# Patient Record
Sex: Male | Born: 1965 | Race: White | Hispanic: No | Marital: Married | Smoking: Current every day smoker
Health system: Southern US, Community
[De-identification: ages and names within clinical notes are randomized; demographics above are authoritative.]

## PROBLEM LIST (undated history)

## (undated) DIAGNOSIS — F32A Depression, unspecified: Secondary | ICD-10-CM

## (undated) DIAGNOSIS — F419 Anxiety disorder, unspecified: Secondary | ICD-10-CM

## (undated) DIAGNOSIS — E785 Hyperlipidemia, unspecified: Secondary | ICD-10-CM

## (undated) DIAGNOSIS — F329 Major depressive disorder, single episode, unspecified: Secondary | ICD-10-CM

## (undated) DIAGNOSIS — I10 Essential (primary) hypertension: Secondary | ICD-10-CM

## (undated) DIAGNOSIS — J449 Chronic obstructive pulmonary disease, unspecified: Secondary | ICD-10-CM

## (undated) DIAGNOSIS — T7840XA Allergy, unspecified, initial encounter: Secondary | ICD-10-CM

## (undated) DIAGNOSIS — K635 Polyp of colon: Secondary | ICD-10-CM

## (undated) DIAGNOSIS — D689 Coagulation defect, unspecified: Secondary | ICD-10-CM

## (undated) DIAGNOSIS — Z8719 Personal history of other diseases of the digestive system: Secondary | ICD-10-CM

## (undated) DIAGNOSIS — K219 Gastro-esophageal reflux disease without esophagitis: Secondary | ICD-10-CM

## (undated) DIAGNOSIS — K589 Irritable bowel syndrome without diarrhea: Secondary | ICD-10-CM

## (undated) DIAGNOSIS — I82409 Acute embolism and thrombosis of unspecified deep veins of unspecified lower extremity: Secondary | ICD-10-CM

## (undated) DIAGNOSIS — Z86718 Personal history of other venous thrombosis and embolism: Secondary | ICD-10-CM

## (undated) DIAGNOSIS — I2699 Other pulmonary embolism without acute cor pulmonale: Secondary | ICD-10-CM

## (undated) HISTORY — DX: Polyp of colon: K63.5

## (undated) HISTORY — PX: WISDOM TOOTH EXTRACTION: SHX21

## (undated) HISTORY — DX: Anxiety disorder, unspecified: F41.9

## (undated) HISTORY — DX: Coagulation defect, unspecified: D68.9

## (undated) HISTORY — DX: Acute embolism and thrombosis of unspecified deep veins of unspecified lower extremity: I82.409

## (undated) HISTORY — PX: UPPER GASTROINTESTINAL ENDOSCOPY: SHX188

## (undated) HISTORY — PX: POLYPECTOMY: SHX149

## (undated) HISTORY — DX: Chronic obstructive pulmonary disease, unspecified: J44.9

## (undated) HISTORY — DX: Essential (primary) hypertension: I10

## (undated) HISTORY — DX: Personal history of other diseases of the digestive system: Z87.19

## (undated) HISTORY — PX: CHOLECYSTECTOMY: SHX55

## (undated) HISTORY — DX: Allergy, unspecified, initial encounter: T78.40XA

## (undated) HISTORY — PX: TONSILLECTOMY AND ADENOIDECTOMY: SUR1326

## (undated) HISTORY — DX: Other pulmonary embolism without acute cor pulmonale: I26.99

## (undated) HISTORY — DX: Gastro-esophageal reflux disease without esophagitis: K21.9

## (undated) HISTORY — DX: Major depressive disorder, single episode, unspecified: F32.9

## (undated) HISTORY — DX: Personal history of other venous thrombosis and embolism: Z86.718

## (undated) HISTORY — DX: Irritable bowel syndrome without diarrhea: K58.9

## (undated) HISTORY — DX: Depression, unspecified: F32.A

## (undated) HISTORY — DX: Hyperlipidemia, unspecified: E78.5

---

## 1998-03-20 ENCOUNTER — Ambulatory Visit (HOSPITAL_COMMUNITY): Admission: RE | Admit: 1998-03-20 | Discharge: 1998-03-20 | Payer: Self-pay | Admitting: Neurosurgery

## 1998-03-20 ENCOUNTER — Encounter: Payer: Self-pay | Admitting: Neurosurgery

## 1998-04-02 ENCOUNTER — Encounter: Admission: RE | Admit: 1998-04-02 | Discharge: 1998-07-01 | Payer: Self-pay | Admitting: Anesthesiology

## 2000-08-11 ENCOUNTER — Emergency Department (HOSPITAL_COMMUNITY): Admission: EM | Admit: 2000-08-11 | Discharge: 2000-08-11 | Payer: Self-pay | Admitting: Emergency Medicine

## 2003-05-21 ENCOUNTER — Emergency Department (HOSPITAL_COMMUNITY): Admission: EM | Admit: 2003-05-21 | Discharge: 2003-05-21 | Payer: Self-pay | Admitting: Emergency Medicine

## 2004-11-06 ENCOUNTER — Ambulatory Visit (HOSPITAL_COMMUNITY): Admission: RE | Admit: 2004-11-06 | Discharge: 2004-11-06 | Payer: Self-pay | Admitting: Specialist

## 2007-05-15 ENCOUNTER — Encounter: Payer: Self-pay | Admitting: Internal Medicine

## 2007-09-06 ENCOUNTER — Encounter: Payer: Self-pay | Admitting: Internal Medicine

## 2008-03-16 ENCOUNTER — Ambulatory Visit: Payer: Self-pay | Admitting: Internal Medicine

## 2008-03-16 DIAGNOSIS — J449 Chronic obstructive pulmonary disease, unspecified: Secondary | ICD-10-CM

## 2008-05-01 ENCOUNTER — Ambulatory Visit: Payer: Self-pay | Admitting: Internal Medicine

## 2008-05-01 DIAGNOSIS — F17200 Nicotine dependence, unspecified, uncomplicated: Secondary | ICD-10-CM

## 2008-05-01 DIAGNOSIS — J984 Other disorders of lung: Secondary | ICD-10-CM | POA: Insufficient documentation

## 2008-05-04 LAB — CONVERTED CEMR LAB: A-1 Antitrypsin, Ser: 178 mg/dL (ref 83–200)

## 2008-05-08 ENCOUNTER — Telehealth (INDEPENDENT_AMBULATORY_CARE_PROVIDER_SITE_OTHER): Payer: Self-pay | Admitting: *Deleted

## 2008-05-08 ENCOUNTER — Ambulatory Visit: Payer: Self-pay | Admitting: Cardiology

## 2010-02-18 ENCOUNTER — Ambulatory Visit: Payer: Self-pay | Admitting: Internal Medicine

## 2010-03-10 ENCOUNTER — Telehealth (INDEPENDENT_AMBULATORY_CARE_PROVIDER_SITE_OTHER): Payer: Self-pay | Admitting: *Deleted

## 2010-04-03 NOTE — Assessment & Plan Note (Signed)
Summary: Pulmonary/ ext ov with hfa 75%    Copy to:  Dr. Arlan Organ Primary Jetta Murray/Referring Vita Currin:  Dr. Arlan Organ  CC:  DOE- the same.  History of Present Illness: 61   yowm started smoking at age 45   new onset cough/breathing difficulty in setting of URI march 09 never bounced back completely in term of doe x 1 flight.  March 16, 2008 ov for copd eval after flare 2 weeks prior to visit last steroids about a week before ov. no cough at time of ov, doe baseline.   started advair not impressed it's helping but needs less combivent   May 01, 2008 on adavir for pft's, no better.  no cough.  rec symbicort and quit smoking  February 18, 2010 ov cc still smoking  doe 2puffs of symbicort every 2 days on avg, still smoking, no change ex tol or chronic cough and congestion. no purulent sputum/ Pt denies any significant sore throat, dysphagia, itching, sneezing,  nasal congestion or excess secretions,  fever, chills, sweats, unintended wt loss, pleuritic or exertional cp, hempoptysis, change in activity tolerance  orthopnea pnd or leg swelling Pt also denies any obvious fluctuation in symptoms with weather or environmental change or other alleviating or aggravating factors.          Preventive Screening-Counseling & Management  Alcohol-Tobacco     Smoking Status: current     Smoking Cessation Counseling: yes  Current Medications (verified): 1)  Alprazolam 1 Mg Tabs (Alprazolam) .Marland Kitchen.. 1 Tab Every 6 Hours As Needed 2)  Symbicort 160-4.5 Mcg/act  Aero (Budesonide-Formoterol Fumarate) .... 2 Puffs First Thing  in Am and 2 Puffs Again in Pm About 12 Hours Later As Needed 3)  Combivent 103-18 Mcg/act Aero (Ipratropium-Albuterol) .... 2 Puffs Every 4 Hours As Needed 4)  Verapamil Hcl Cr 180 Mg Cr-Tabs (Verapamil Hcl) .Marland Kitchen.. 1 Once Daily 5)  Viibryd 10 Mg Tabs (Vilazodone Hcl) .Marland Kitchen.. 1 Once Daily  Allergies (verified): 1)  ! Pcn 2)  ! Codeine  Past History:  Past Medical  History: manic depressive disorder depression headache, migraine back pain tobacco abuse COPD     - PFT's May 01, 2008 FEV1 2.17 (55%) ratio 40,  no better with B2, DLC0 80     - Alpha 1AT May 01, 2008 > nl levels, neg Z/S     - HFA teaching 75% effective p coaching abdominal pain, periumbilical irritable bowel syndrome Ischemic heart disease, chronic Pulmonary Nodule      -  CT 05/15/2007 RML 5 mm      -  CT  05/08/2008 RML 4 mm      - Not viz on plain cxr February 18, 2010         Vital Signs:  Patient profile:   45 year old male Weight:      208 pounds BMI:     29.11 O2 Sat:      98 % on Room air Temp:     98.2 degrees F oral Pulse rate:   98 / minute BP sitting:   152 / 90  (left arm)  Vitals Entered By: Vernie Murders (February 18, 2010 10:49 AM)  O2 Flow:  Room air  Physical Exam  Additional Exam:  wt 226  03/16/08 > 232 May 01, 2008 > 208 February 18, 2010  HEENT mild turbinate edema.  Oropharynx no thrush or excess pnd or cobblestoning.  No JVD or cervical adenopathy. Mild accessory muscle hypertrophy.  Trachea midline, nl thryroid. Chest was hyperinflated by percussion with diminished breath sounds and moderate increased exp time without wheeze. Hoover sign positive at mid inspiration. Regular rate and rhythm without murmur gallop or rub or increase P2. No edema. Abd: no hsm, nl excursion. Ext warm without cyanosis or clubbing.     CXR  Procedure date:  02/18/2010  Findings:      Comparison: May 21, 2003 and CT dated May 08, 2008   Findings: The cardiac silhouette, mediastinum, pulmonary vasculature are within normal limits.  Both lungs are clear.  The previously described 4 mm nodule in the right middle lobe is not seen on plain film. There is no acute bony abnormality.   IMPRESSION: There is no evidence of acute cardiac or pulmonary process.  Impression & Recommendations:  Problem # 1:  COPD UNSPECIFIED (ICD-496) GOLD II and still smoking      DDX of  difficult airways managment all start with A and  include Adherence, Ace Inhibitors, Acid Reflux, Active Sinus Disease, Alpha 1 Antitripsin deficiency, Anxiety masquerading as Airways dz,  ABPA,  allergy(esp in young), Aspiration (esp in elderly), Adverse effects of DPI,  Active smokers, plus one B  = Beta blocker use.Marland Kitchen    Active smoking greatest concern  Adherence:    I spent extra time with the patient today explaining optimal mdi  technique.  This improved from  50> 75%   Problem # 2:  SMOKER (ICD-305.1)   I emphasized that although we never turn away smokers from the pulmonary clinic, we do ask that they understand that the recommendations that were made won't work nearly as well in the presence of continued cigarette exposure and we may reach a point where we can't help the patient if he/she can't quit smoking.      Problem # 3:  PULMONARY NODULE (ICD-518.89) Discussed in detail all the  indications, usual  risks and alternatives  relative to the benefits with patient who agrees to proceed with conservative f/u with cxr yearly.  Main issue is stopping smoking to reduce risk  Advised that serial screening  CT scans are not the standard yet and the nodule we were concerned about had shrunk on last study and no viz on plain film now almost 3 years later  Medications Added to Medication List This Visit: 1)  Symbicort 160-4.5 Mcg/act Aero (Budesonide-formoterol fumarate) .... 2 puffs first thing  in am and 2 puffs again in pm about 12 hours later as needed 2)  Verapamil Hcl Cr 180 Mg Cr-tabs (Verapamil hcl) .Marland Kitchen.. 1 once daily 3)  Viibryd 10 Mg Tabs (Vilazodone hcl) .Marland Kitchen.. 1 once daily  Other Orders: T-2 View CXR (71020TC) Est. Patient Level IV (09811)  Patient Instructions: 1)  Symbicort 160 2 puffs first thing  in am and 2 puffs again in pm about 12 hours later and then brush teeth 2)  Stopping smoking is the most important aspect of your care. 3)  Yearly cxr is the standard way  to screen for lung cancer.at this point

## 2010-04-03 NOTE — Progress Notes (Signed)
  Phone Note Other Incoming   Request: Send information Summary of Call: Request for records received from [facility]. Request forwarded to [facility].     

## 2011-02-23 ENCOUNTER — Other Ambulatory Visit: Payer: Self-pay | Admitting: Allergy

## 2016-04-01 HISTORY — PX: COLONOSCOPY: SHX174

## 2016-04-30 DIAGNOSIS — Z86718 Personal history of other venous thrombosis and embolism: Secondary | ICD-10-CM

## 2016-04-30 HISTORY — DX: Personal history of other venous thrombosis and embolism: Z86.718

## 2016-12-17 DIAGNOSIS — Z23 Encounter for immunization: Secondary | ICD-10-CM | POA: Diagnosis not present

## 2017-04-28 DIAGNOSIS — J0100 Acute maxillary sinusitis, unspecified: Secondary | ICD-10-CM | POA: Diagnosis not present

## 2017-04-28 DIAGNOSIS — J449 Chronic obstructive pulmonary disease, unspecified: Secondary | ICD-10-CM | POA: Diagnosis not present

## 2017-05-19 ENCOUNTER — Encounter: Payer: Self-pay | Admitting: Gastroenterology

## 2017-06-09 DIAGNOSIS — Z79899 Other long term (current) drug therapy: Secondary | ICD-10-CM | POA: Diagnosis not present

## 2017-06-09 DIAGNOSIS — E782 Mixed hyperlipidemia: Secondary | ICD-10-CM | POA: Diagnosis not present

## 2017-06-10 DIAGNOSIS — Z1331 Encounter for screening for depression: Secondary | ICD-10-CM | POA: Diagnosis not present

## 2017-06-10 DIAGNOSIS — I10 Essential (primary) hypertension: Secondary | ICD-10-CM | POA: Diagnosis not present

## 2017-06-10 DIAGNOSIS — J449 Chronic obstructive pulmonary disease, unspecified: Secondary | ICD-10-CM | POA: Diagnosis not present

## 2017-06-10 DIAGNOSIS — E782 Mixed hyperlipidemia: Secondary | ICD-10-CM | POA: Diagnosis not present

## 2017-06-10 DIAGNOSIS — R140 Abdominal distension (gaseous): Secondary | ICD-10-CM | POA: Diagnosis not present

## 2017-06-18 DIAGNOSIS — R140 Abdominal distension (gaseous): Secondary | ICD-10-CM | POA: Diagnosis not present

## 2017-06-18 DIAGNOSIS — K5730 Diverticulosis of large intestine without perforation or abscess without bleeding: Secondary | ICD-10-CM | POA: Diagnosis not present

## 2017-07-12 DIAGNOSIS — I491 Atrial premature depolarization: Secondary | ICD-10-CM | POA: Diagnosis not present

## 2017-07-12 DIAGNOSIS — R001 Bradycardia, unspecified: Secondary | ICD-10-CM | POA: Diagnosis not present

## 2017-07-12 DIAGNOSIS — J441 Chronic obstructive pulmonary disease with (acute) exacerbation: Secondary | ICD-10-CM | POA: Diagnosis not present

## 2017-07-12 DIAGNOSIS — I10 Essential (primary) hypertension: Secondary | ICD-10-CM | POA: Diagnosis not present

## 2017-07-12 DIAGNOSIS — E785 Hyperlipidemia, unspecified: Secondary | ICD-10-CM | POA: Diagnosis not present

## 2017-07-12 DIAGNOSIS — R0602 Shortness of breath: Secondary | ICD-10-CM | POA: Diagnosis not present

## 2017-07-12 DIAGNOSIS — R05 Cough: Secondary | ICD-10-CM | POA: Diagnosis not present

## 2017-07-12 DIAGNOSIS — R062 Wheezing: Secondary | ICD-10-CM | POA: Diagnosis not present

## 2017-07-13 DIAGNOSIS — R001 Bradycardia, unspecified: Secondary | ICD-10-CM | POA: Diagnosis not present

## 2017-07-13 DIAGNOSIS — I10 Essential (primary) hypertension: Secondary | ICD-10-CM | POA: Diagnosis not present

## 2017-07-13 DIAGNOSIS — E785 Hyperlipidemia, unspecified: Secondary | ICD-10-CM | POA: Diagnosis not present

## 2017-07-13 DIAGNOSIS — J449 Chronic obstructive pulmonary disease, unspecified: Secondary | ICD-10-CM | POA: Diagnosis not present

## 2017-07-13 DIAGNOSIS — J441 Chronic obstructive pulmonary disease with (acute) exacerbation: Secondary | ICD-10-CM | POA: Diagnosis not present

## 2017-07-13 DIAGNOSIS — I491 Atrial premature depolarization: Secondary | ICD-10-CM | POA: Diagnosis not present

## 2017-07-13 DIAGNOSIS — J189 Pneumonia, unspecified organism: Secondary | ICD-10-CM | POA: Diagnosis not present

## 2017-08-02 DIAGNOSIS — Z0000 Encounter for general adult medical examination without abnormal findings: Secondary | ICD-10-CM | POA: Diagnosis not present

## 2017-08-02 DIAGNOSIS — J449 Chronic obstructive pulmonary disease, unspecified: Secondary | ICD-10-CM | POA: Diagnosis not present

## 2017-08-11 ENCOUNTER — Other Ambulatory Visit: Payer: Self-pay

## 2017-08-11 NOTE — Patient Outreach (Signed)
[facility]) Care Management  08/11/2017  SAHEJ SCHRIEBER [birth year] [id number]   Medication Adherence call to Mr : Leeon Makar left a message for patient to call back Mr : Bartoszek is past due on olmesatan/HCTX 20/12 :5  under [place].  [place] Management Direct Dial [phone]  Fax [phone] [email or web address]

## 2017-08-13 DIAGNOSIS — J449 Chronic obstructive pulmonary disease, unspecified: Secondary | ICD-10-CM | POA: Diagnosis not present

## 2017-08-17 ENCOUNTER — Encounter: Payer: Self-pay | Admitting: Gastroenterology

## 2017-09-12 DIAGNOSIS — J449 Chronic obstructive pulmonary disease, unspecified: Secondary | ICD-10-CM | POA: Diagnosis not present

## 2017-09-20 ENCOUNTER — Other Ambulatory Visit: Payer: Self-pay

## 2017-09-20 ENCOUNTER — Encounter: Payer: Self-pay | Admitting: Gastroenterology

## 2017-09-20 NOTE — Patient Outreach (Signed)
[facility]) Care Management  09/20/2017  MUNEER LEIDER 1965-07-25 [id number]   Medication Adherence call to Mr. Oronde Hallenbeck spoke with patient's wife she said doctor gave him a new prescription for a lower mg on Olmesartan/Hctz 20/12.5g patient has been  cutting it in 1/2 on the old prescription until he is done with the old prescription and he will start with the new new prescription once he is finished with the old prescription  patient will pick up Simvastatin later on during the month.Mr.Swain is showing past due under [place].  [place] Management Direct Dial [phone]  Fax [phone] [email or web address]

## 2017-09-21 ENCOUNTER — Encounter: Payer: Self-pay | Admitting: Gastroenterology

## 2017-09-21 ENCOUNTER — Encounter

## 2017-09-21 ENCOUNTER — Ambulatory Visit (INDEPENDENT_AMBULATORY_CARE_PROVIDER_SITE_OTHER): Payer: Medicare Other | Admitting: Gastroenterology

## 2017-09-21 VITALS — BP 102/78 | HR 63 | Ht 71.0 in | Wt 233.4 lb

## 2017-09-21 DIAGNOSIS — Z86010 Personal history of colonic polyps: Secondary | ICD-10-CM

## 2017-09-21 DIAGNOSIS — K625 Hemorrhage of anus and rectum: Secondary | ICD-10-CM

## 2017-09-21 MED ORDER — SOD PICOSULFATE-MAG OX-CIT ACD 10-3.5-12 MG-GM -GM/160ML PO SOLN: 1.0000 | Freq: Once | ORAL | 0 refills | Status: AC

## 2017-09-21 NOTE — Patient Instructions (Signed)
If you are age 52 or older, your body mass index should be between 23-30. Your Body mass index is 32.55 kg/m. If this is out of the aforementioned range listed, please consider follow up with your Primary Care Provider.  If you are age 24 or younger, your body mass index should be between 19-25. Your Body mass index is 32.55 kg/m. If this is out of the aformentioned range listed, please consider follow up with your Primary Care Provider.   You have been scheduled for a colonoscopy. Please follow written instructions given to you at your visit today.  Please pick up your prep supplies at the pharmacy within the next 1-3 days. If you use inhalers (even only as needed), please bring them with you on the day of your procedure. Your physician has requested that you go to www.startemmi.com and enter the access code given to you at your visit today. This web site gives a general overview about your procedure. However, you should still follow specific instructions given to you by our office regarding your preparation for the procedure.  You will be contacted by our office prior to your procedure for directions on holding your Eliquis.  If you do not hear from our office 1 week prior to your scheduled procedure, please call [phone] to discuss.    Thank you,  Dr. Jackquline Denmark

## 2017-09-21 NOTE — Progress Notes (Signed)
Chief Complaint: For colonoscopy  Referring Provider:  Dr Helene Kelp      ASSESSMENT AND PLAN;   #1. Rectal Bleeding (resolved)  #2. H/O Multiple polyps (colonoscopy 04/01/2016 - 10 polyps)  Plan: -Repeat colonoscopy to clear the colon of any residual/recurrent polyps and for further evaluation of rectal bleeding.  I have discussed the risks and benefits.  The risks including risk of perforation requiring laparotomy, bleeding after polypectomy requiring blood transfusions and risks of anesthesia/sedation were discussed.  Rare risks of missing colorectal neoplasms were also discussed.  Alternatives were given.  Patient is fully aware and agrees to proceed. All the questions were answered. Colonoscopy will be scheduled in upcoming days.  Patient is to report immediately if there is any significant weight loss or excessive bleeding until then. Consent forms were given for review.  -High-fiber diet -Preparation H on as-needed basis -Urged him to cut down and stop smoking.   HPI:    Jason Barajas is a 52 y.o. male  Rectal bleeding x few weeks intermittently Resolved now Denies having any significant diarrhea constipation Denies having any weight loss No nonsteroidals 04/2016 -had DVT with PE, took Eliquis bid, off 4 weeks per PCP Has COPD with continued smoking. Had colonoscopy 04/01/2016 ([facility]) 10 colonic polyps status post polypectomy (tubular adenomas), mild sigmoid diverticulosis. Advised to get repeat colonoscopy.  Past Medical History:  Diagnosis Date  . Anxiety   . Colon polyps   . COPD, severe ([place])   . Depression   . H/O blood clots 04/2016  . History of anal fissures   . Hypertension   . IBS (irritable bowel syndrome)     Past Surgical History:  Procedure Laterality Date  . COLONOSCOPY  04/01/2016   Significant colonic polyps status post polypectomy. Minimal sig,oid diverticulosis. Tubular adenomas.     History reviewed. No pertinent family history.  Social  History   Tobacco Use  . Smoking status: Current Every Day Smoker    Packs/day: 1.00  . Smokeless tobacco: Never Used  Substance Use Topics  . Alcohol use: Not Currently  . Drug use: Never    Current Outpatient Medications  Medication Sig Dispense Refill  . albuterol (PROVENTIL HFA;VENTOLIN HFA) 108 (90 Base) MCG/ACT inhaler Inhale 2 puffs into the lungs as needed.    . ALPRAZolam (XANAX) 1 MG tablet Take 1 mg by mouth daily.    . budesonide-formoterol (SYMBICORT) 160-4.5 MCG/ACT inhaler Inhale 2 puffs into the lungs 2 (two) times daily.    . citalopram (CELEXA) 40 MG tablet Take 40 mg by mouth daily.    Marland Kitchen ELIQUIS 2.5 MG TABS tablet Take 2.5 mg by mouth daily.    Marland Kitchen gabapentin (NEURONTIN) 300 MG capsule Take 600 mg by mouth as needed.    Marland Kitchen olmesartan-hydrochlorothiazide (BENICAR HCT) 20-12.5 MG tablet Take 1 tablet by mouth daily.  11  . simvastatin (ZOCOR) 20 MG tablet Take 20 mg by mouth daily.     No current facility-administered medications for this visit.     Allergies  Allergen Reactions  . Codeine   . Penicillins     Review of Systems:  Constitutional: Denies fever, chills, diaphoresis, appetite change and fatigue.  HEENT: Denies photophobia, eye pain, redness, hearing loss, ear pain, congestion, sore throat, rhinorrhea, sneezing, mouth sores, neck pain, neck stiffness and tinnitus.   Respiratory: Denies SOB, DOE, cough, chest tightness,  and wheezing.   Cardiovascular: Denies chest pain, palpitations and leg swelling.  Genitourinary: Denies dysuria, urgency, frequency, hematuria,  flank pain and difficulty urinating.  Musculoskeletal: Denies myalgias, back pain, joint swelling, arthralgias and gait problem.  Skin: No rash.  Neurological: Denies dizziness, seizures, syncope, weakness, light-headedness, numbness and headaches.  Hematological: Denies adenopathy. Easy bruising, personal or family bleeding history  Psychiatric/Behavioral: No anxiety or depression      Physical Exam:    BP 102/78   Pulse 63   Ht 5\' 11"  (1.803 m)   Wt 233 lb 6 oz (105.9 kg)   SpO2 93%   BMI 32.55 kg/m  Filed Weights   09/21/17 1600  Weight: 233 lb 6 oz (105.9 kg)   Constitutional:  Well-developed, in no acute distress. Psychiatric: Normal mood and affect. Behavior is normal. HEENT: Pupils normal.  Conjunctivae are normal. No scleral icterus. Neck supple.  Cardiovascular: Normal rate, regular rhythm. No edema Pulmonary/chest: Effort normal and B/L decreased breath sounds.  no wheezing, rales or rhonchi. Abdominal: Soft, nondistended. Nontender. Bowel sounds active throughout. There are no masses palpable. No hepatomegaly. Rectal:  defered Neurological: Alert and oriented to person place and time. Skin: Skin is warm and dry. No rashes noted.    Carmell Austria, MD 09/21/2017, 4:24 PM  Cc: Dr Helene Kelp

## 2017-09-30 ENCOUNTER — Encounter: Payer: Self-pay | Admitting: Gastroenterology

## 2017-10-06 ENCOUNTER — Telehealth: Payer: Self-pay | Admitting: Gastroenterology

## 2017-10-06 MED ORDER — SOD PICOSULFATE-MAG OX-CIT ACD 10-3.5-12 MG-GM -GM/160ML PO SOLN: 1.0000 | Freq: Once | ORAL | 0 refills | Status: AC

## 2017-10-08 NOTE — Telephone Encounter (Signed)
Sent Clenpiq to pharmacy.

## 2017-10-11 ENCOUNTER — Telehealth: Payer: Self-pay

## 2017-10-11 NOTE — Telephone Encounter (Signed)
Faxed letter to hold Eliquis to Dr. Helene Kelp.

## 2017-10-11 NOTE — Telephone Encounter (Signed)
I spoke with Jason Barajas at Dr. Danella Deis office who said that their records indicated that patient had stopped Eliquis on 08/17/17. However if he was still taking medication then to hold Eliquis 10/12/17 and 10/13/17. I called and spoke with patient and instructed him to hold medication 10/12/17 and 10/13/17.

## 2017-10-13 ENCOUNTER — Encounter: Payer: Self-pay | Admitting: Gastroenterology

## 2017-10-13 ENCOUNTER — Ambulatory Visit (AMBULATORY_SURGERY_CENTER): Payer: Medicare Other | Admitting: Gastroenterology

## 2017-10-13 VITALS — BP 112/70 | HR 61 | Temp 99.1°F | Resp 16 | Ht 71.0 in | Wt 233.0 lb

## 2017-10-13 DIAGNOSIS — D122 Benign neoplasm of ascending colon: Secondary | ICD-10-CM | POA: Diagnosis not present

## 2017-10-13 DIAGNOSIS — D123 Benign neoplasm of transverse colon: Secondary | ICD-10-CM

## 2017-10-13 DIAGNOSIS — Z86010 Personal history of colonic polyps: Secondary | ICD-10-CM | POA: Diagnosis not present

## 2017-10-13 DIAGNOSIS — K625 Hemorrhage of anus and rectum: Secondary | ICD-10-CM

## 2017-10-13 DIAGNOSIS — Z1211 Encounter for screening for malignant neoplasm of colon: Secondary | ICD-10-CM | POA: Diagnosis not present

## 2017-10-13 DIAGNOSIS — D125 Benign neoplasm of sigmoid colon: Secondary | ICD-10-CM | POA: Diagnosis not present

## 2017-10-13 DIAGNOSIS — D120 Benign neoplasm of cecum: Secondary | ICD-10-CM | POA: Diagnosis not present

## 2017-10-13 DIAGNOSIS — J449 Chronic obstructive pulmonary disease, unspecified: Secondary | ICD-10-CM | POA: Diagnosis not present

## 2017-10-13 MED ORDER — SODIUM CHLORIDE 0.9 % IV SOLN: 500.0000 mL | Freq: Once | INTRAVENOUS | Status: DC

## 2017-10-13 NOTE — Progress Notes (Signed)
Called to room to assist during endoscopic procedure.  Patient ID and intended procedure confirmed with present staff. Received instructions for my participation in the procedure from the performing physician.  

## 2017-10-13 NOTE — Progress Notes (Signed)
Alert and oriented x3, pleased with MAC, report to RN  

## 2017-10-13 NOTE — Op Note (Signed)
[facility] Patient Name: Jason Barajas Procedure Date: 10/13/2017 3:34 PM MRN: [id number] Endoscopist: Jackquline Denmark , MD Age: 52 Referring MD:  Date of Birth: 1965-12-07 Gender: Male Account #: [id number] Procedure:                Colonoscopy Indications:              High risk colon cancer surveillance: Personal                            history of several colonic polyps 03/2016 Medicines:                Monitored Anesthesia Care Procedure:                Pre-Anesthesia Assessment:                           - Prior to the procedure, a History and Physical                            was performed, and patient medications and                            allergies were reviewed. The patient's tolerance of                            previous anesthesia was also reviewed. The risks                            and benefits of the procedure and the sedation                            options and risks were discussed with the patient.                            All questions were answered, and informed consent                            was obtained. Prior Anticoagulants: The patient has                            taken Eliquis (apixaban), last dose was 7 days                            prior to procedure. ASA Grade Assessment: II - A                            patient with mild systemic disease. After reviewing                            the risks and benefits, the patient was deemed in                            satisfactory condition to undergo the procedure.  After obtaining informed consent, the colonoscope                            was passed under direct vision. Throughout the                            procedure, the patient's blood pressure, pulse, and                            oxygen saturations were monitored continuously. The                            Colonoscope was introduced through the anus and                            advanced to the  the cecum, identified by                            appendiceal orifice and ileocecal valve. The                            colonoscopy was performed without difficulty. The                            patient tolerated the procedure well. The quality                            of the bowel preparation was adequate to identify                            polyps 6 mm and larger in size. Scope In: 3:47:54 PM Scope Out: 4:16:08 PM Scope Withdrawal Time: 0 hours 23 minutes 50 seconds  Total Procedure Duration: 0 hours 28 minutes 14 seconds  Findings:                 A 4 mm polyp was found in the cecum. The polyp was                            sessile. The polyp was removed with a cold biopsy                            forceps. Resection and retrieval were complete.                           Three sessile polyps were found in the ascending                            colon. The polyps were 6 to 8 mm in size. These                            polyps were removed with a cold snare. Resection  and retrieval were complete.                           Two sessile polyps were found in the mid transverse                            colon. The polyps were 4 to 6 mm in size. These                            polyps were removed with a cold snare. Resection                            and retrieval were complete.                           One sessile 4 mm polyp found in the mid sigmoid                            colon, removed with a cold biopsy forceps.                            Resection and retrieval were complete.                           A few diverticula were found in the sigmoid colon.                           Non-bleeding internal hemorrhoids were found. The                            hemorrhoids were small.                           The exam was otherwise without abnormality on                            direct and retroflexion views. Complications:            No immediate  complications. Estimated Blood Loss:     Estimated blood loss: none. Impression:               - Colonic polyps status post polypectomy.                           - Mild sigmoid diverticulosis. Recommendation:           - Patient has a contact number available for                            emergencies. The signs and symptoms of potential                            delayed complications were discussed with the                            patient. Return to  normal activities tomorrow.                            Written discharge instructions were provided to the                            patient.                           - Resume previous diet.                           - Continue present medications.                           - Await pathology results.                           - Repeat colonoscopy for surveillance based on                            pathology results.                           - Return to GI clinic PRN.                           - Resume taking Eliquis (apixaban) at your prior                            dose in 2 days. Jackquline Denmark, MD 10/13/2017 4:29:31 PM This report has been signed electronically.

## 2017-10-13 NOTE — Patient Instructions (Signed)
Thank you for allowing [place] to care for you today!  Await pathology results by mail, 2 weeks.  Next colonoscopy dependent on results of pathology.  Resume Eliquis at your prior dose in 2 days.  (Friday 10/15/17).    YOU HAD AN ENDOSCOPIC PROCEDURE TODAY AT [facility]:   Refer to the procedure report that was given to you for any specific questions about what was found during the examination.  If the procedure report does not answer your questions, please call your gastroenterologist to clarify.  If you requested that your care partner not be given the details of your procedure findings, then the procedure report has been included in a sealed envelope for you to review at your convenience later.  YOU SHOULD EXPECT: Some feelings of bloating in the abdomen. Passage of more gas than usual.  Walking can help get rid of the air that was put into your GI tract during the procedure and reduce the bloating. If you had a lower endoscopy (such as a colonoscopy or flexible sigmoidoscopy) you may notice spotting of blood in your stool or on the toilet paper. If you underwent a bowel prep for your procedure, you may not have a normal bowel movement for a few days.  Please Note:  You might notice some irritation and congestion in your nose or some drainage.  This is from the oxygen used during your procedure.  There is no need for concern and it should clear up in a day or so.  SYMPTOMS TO REPORT IMMEDIATELY:   Following lower endoscopy (colonoscopy or flexible sigmoidoscopy):  Excessive amounts of blood in the stool  Significant tenderness or worsening of abdominal pains  Swelling of the abdomen that is new, acute  Fever of 100F or higher    For urgent or emergent issues, a gastroenterologist can be reached at any hour by calling [phone].   DIET:  We do recommend a small meal at first, but then you may proceed to your regular diet.  Drink plenty of fluids but you should avoid  alcoholic beverages for 24 hours.  ACTIVITY:  You should plan to take it easy for the rest of today and you should NOT DRIVE or use heavy machinery until tomorrow (because of the sedation medicines used during the test).    FOLLOW UP: Our staff will call the number listed on your records the next business day following your procedure to check on you and address any questions or concerns that you may have regarding the information given to you following your procedure. If we do not reach you, we will leave a message.  However, if you are feeling well and you are not experiencing any problems, there is no need to return our call.  We will assume that you have returned to your regular daily activities without incident.  If any biopsies were taken you will be contacted by phone or by letter within the next 1-3 weeks.  Please call us at [phone] if you have not heard about the biopsies in 3 weeks.    SIGNATURES/CONFIDENTIALITY: You and/or your care partner have signed paperwork which will be entered into your electronic medical record.  These signatures attest to the fact that that the information above on your After Visit Summary has been reviewed and is understood.  Full responsibility of the confidentiality of this discharge information lies with you and/or your care-partner.

## 2017-10-14 ENCOUNTER — Telehealth: Payer: Self-pay

## 2017-10-14 NOTE — Telephone Encounter (Signed)
  Follow up Call-  Call back number 10/13/2017  Post procedure Call Back phone  # [phone] cell  Permission to leave phone message Yes  Some recent data might be hidden     Patient questions:  Do you have a fever, pain , or abdominal swelling? No. Pain Score  0 *  Have you tolerated food without any problems? Yes.    Have you been able to return to your normal activities? Yes.    Do you have any questions about your discharge instructions: Diet   No. Medications  No. Follow up visit  No.  Do you have questions or concerns about your Care? No.  Actions: * If pain score is 4 or above: No action needed, pain <4.

## 2017-10-21 ENCOUNTER — Encounter: Payer: Self-pay | Admitting: Gastroenterology

## 2017-11-13 DIAGNOSIS — J449 Chronic obstructive pulmonary disease, unspecified: Secondary | ICD-10-CM | POA: Diagnosis not present

## 2017-12-13 DIAGNOSIS — J449 Chronic obstructive pulmonary disease, unspecified: Secondary | ICD-10-CM | POA: Diagnosis not present

## 2017-12-13 DIAGNOSIS — E782 Mixed hyperlipidemia: Secondary | ICD-10-CM | POA: Diagnosis not present

## 2017-12-13 DIAGNOSIS — Z79899 Other long term (current) drug therapy: Secondary | ICD-10-CM | POA: Diagnosis not present

## 2017-12-13 DIAGNOSIS — Z23 Encounter for immunization: Secondary | ICD-10-CM | POA: Diagnosis not present

## 2017-12-13 DIAGNOSIS — I10 Essential (primary) hypertension: Secondary | ICD-10-CM | POA: Diagnosis not present

## 2018-01-13 DIAGNOSIS — J449 Chronic obstructive pulmonary disease, unspecified: Secondary | ICD-10-CM | POA: Diagnosis not present

## 2018-01-24 DIAGNOSIS — J209 Acute bronchitis, unspecified: Secondary | ICD-10-CM | POA: Diagnosis not present

## 2018-01-24 DIAGNOSIS — J449 Chronic obstructive pulmonary disease, unspecified: Secondary | ICD-10-CM | POA: Diagnosis not present

## 2018-02-12 DIAGNOSIS — J449 Chronic obstructive pulmonary disease, unspecified: Secondary | ICD-10-CM | POA: Diagnosis not present

## 2018-03-15 DIAGNOSIS — J449 Chronic obstructive pulmonary disease, unspecified: Secondary | ICD-10-CM | POA: Diagnosis not present

## 2018-03-29 ENCOUNTER — Other Ambulatory Visit: Payer: Self-pay

## 2018-03-29 NOTE — Patient Outreach (Signed)
[facility]) Care Management  03/29/2018  Jason Barajas Jan 20, 1966 [id number]   Medication Adherence call to Mr. Jason Barajas patient did not answer patient is due on Olmesartan/HCTZ 20/12.5 mg and Simvastatin 20 mg. he is receiving both medication from two different pharmacy [facility],patient has pick up both medication on 03/21/18 for a 30 days supply.Jason Barajas is showing past due under [place].   [place] Management Direct Dial [phone]  Fax [phone] [email or web address]

## 2018-04-15 DIAGNOSIS — J449 Chronic obstructive pulmonary disease, unspecified: Secondary | ICD-10-CM | POA: Diagnosis not present

## 2018-07-11 DIAGNOSIS — J449 Chronic obstructive pulmonary disease, unspecified: Secondary | ICD-10-CM | POA: Diagnosis not present

## 2018-07-11 DIAGNOSIS — E782 Mixed hyperlipidemia: Secondary | ICD-10-CM | POA: Diagnosis not present

## 2018-07-11 DIAGNOSIS — Z0000 Encounter for general adult medical examination without abnormal findings: Secondary | ICD-10-CM | POA: Diagnosis not present

## 2018-07-11 DIAGNOSIS — Z79899 Other long term (current) drug therapy: Secondary | ICD-10-CM | POA: Diagnosis not present

## 2018-07-11 DIAGNOSIS — I10 Essential (primary) hypertension: Secondary | ICD-10-CM | POA: Diagnosis not present

## 2018-09-14 ENCOUNTER — Emergency Department (HOSPITAL_COMMUNITY): Payer: Medicare Other

## 2018-09-14 ENCOUNTER — Other Ambulatory Visit: Payer: Self-pay

## 2018-09-14 ENCOUNTER — Emergency Department (HOSPITAL_COMMUNITY)
Admission: EM | Admit: 2018-09-14 | Discharge: 2018-09-14 | Disposition: A | Discharge status: Home / Self Care | Payer: Medicare Other | Attending: Emergency Medicine | Admitting: Emergency Medicine

## 2018-09-14 DIAGNOSIS — R0602 Shortness of breath: Secondary | ICD-10-CM | POA: Diagnosis not present

## 2018-09-14 DIAGNOSIS — J449 Chronic obstructive pulmonary disease, unspecified: Secondary | ICD-10-CM | POA: Diagnosis not present

## 2018-09-14 DIAGNOSIS — F17210 Nicotine dependence, cigarettes, uncomplicated: Secondary | ICD-10-CM | POA: Diagnosis not present

## 2018-09-14 DIAGNOSIS — Z79899 Other long term (current) drug therapy: Secondary | ICD-10-CM | POA: Diagnosis not present

## 2018-09-14 DIAGNOSIS — Z9049 Acquired absence of other specified parts of digestive tract: Secondary | ICD-10-CM | POA: Diagnosis not present

## 2018-09-14 DIAGNOSIS — R0789 Other chest pain: Secondary | ICD-10-CM | POA: Insufficient documentation

## 2018-09-14 DIAGNOSIS — Z7901 Long term (current) use of anticoagulants: Secondary | ICD-10-CM | POA: Insufficient documentation

## 2018-09-14 DIAGNOSIS — R079 Chest pain, unspecified: Secondary | ICD-10-CM | POA: Diagnosis present

## 2018-09-14 DIAGNOSIS — R52 Pain, unspecified: Secondary | ICD-10-CM | POA: Diagnosis not present

## 2018-09-14 DIAGNOSIS — I10 Essential (primary) hypertension: Secondary | ICD-10-CM | POA: Insufficient documentation

## 2018-09-14 DIAGNOSIS — M5489 Other dorsalgia: Secondary | ICD-10-CM | POA: Diagnosis not present

## 2018-09-14 LAB — COMPREHENSIVE METABOLIC PANEL
ALT: 19 U/L (ref 0–44)
AST: 17 U/L (ref 15–41)
Albumin: 3.9 g/dL (ref 3.5–5.0)
Alkaline Phosphatase: 61 U/L (ref 38–126)
Anion gap: 10 (ref 5–15)
BUN: 6 mg/dL (ref 6–20)
CO2: 25 mmol/L (ref 22–32)
Calcium: 9.1 mg/dL (ref 8.9–10.3)
Chloride: 102 mmol/L (ref 98–111)
Creatinine, Ser: 0.77 mg/dL (ref 0.61–1.24)
GFR calc Af Amer: >60 mL/min (ref >60)
GFR calc non Af Amer: >60 mL/min (ref >60)
Glucose, Bld: 100 mg/dL — ABNORMAL HIGH (ref 70–99)
Potassium: 4.3 mmol/L (ref 3.5–5.1)
Sodium: 137 mmol/L (ref 135–145)
Total Bilirubin: 0.5 mg/dL (ref 0.3–1.2)
Total Protein: 7.2 g/dL (ref 6.5–8.1)

## 2018-09-14 LAB — CBC WITH DIFFERENTIAL/PLATELET
Abs Immature Granulocytes: 0.09 10*3/uL — ABNORMAL HIGH (ref 0.00–0.07)
Basophils Absolute: 0.1 10*3/uL (ref 0.0–0.1)
Basophils Relative: 1 %
Eosinophils Absolute: 0.1 10*3/uL (ref 0.0–0.5)
Eosinophils Relative: 2 %
HCT: 51.7 % (ref 39.0–52.0)
Hemoglobin: 16.5 g/dL (ref 13.0–17.0)
Immature Granulocytes: 1 %
Lymphocytes Relative: 18 %
Lymphs Abs: 1.5 10*3/uL (ref 0.7–4.0)
MCH: 31.5 pg (ref 26.0–34.0)
MCHC: 31.9 g/dL (ref 30.0–36.0)
MCV: 98.7 fL (ref 80.0–100.0)
Monocytes Absolute: 0.7 10*3/uL (ref 0.1–1.0)
Monocytes Relative: 8 %
Neutro Abs: 5.8 10*3/uL (ref 1.7–7.7)
Neutrophils Relative %: 70 %
Platelets: 208 10*3/uL (ref 150–400)
RBC: 5.24 MIL/uL (ref 4.22–5.81)
RDW: 13 % (ref 11.5–15.5)
WBC: 8.2 10*3/uL (ref 4.0–10.5)
nRBC: 0 % (ref 0.0–0.2)

## 2018-09-14 LAB — URINALYSIS, ROUTINE W REFLEX MICROSCOPIC
Bilirubin Urine: NEGATIVE
Glucose, UA: NEGATIVE mg/dL
Hgb urine dipstick: NEGATIVE
Ketones, ur: NEGATIVE mg/dL
Leukocytes,Ua: NEGATIVE
Nitrite: NEGATIVE
Protein, ur: NEGATIVE mg/dL
Specific Gravity, Urine: 1.011 (ref 1.005–1.030)
pH: 7 (ref 5.0–8.0)

## 2018-09-14 LAB — TROPONIN I (HIGH SENSITIVITY)
Troponin I (High Sensitivity): 4 ng/L (ref <18)
Troponin I (High Sensitivity): 6 ng/L (ref <18)

## 2018-09-14 MED ORDER — PREDNISONE 20 MG PO TABS
60.0000 mg | ORAL_TABLET | Freq: Once | ORAL | Status: AC
  Administered 2018-09-14: 60 mg via ORAL
  Filled 2018-09-14: qty 3

## 2018-09-14 MED ORDER — IPRATROPIUM BROMIDE HFA 17 MCG/ACT IN AERS
4.0000 | INHALATION_SPRAY | Freq: Once | RESPIRATORY_TRACT | Status: AC
  Administered 2018-09-14: 4 via RESPIRATORY_TRACT
  Filled 2018-09-14: qty 12.9

## 2018-09-14 MED ORDER — IOHEXOL 350 MG/ML SOLN
75.0000 mL | Freq: Once | INTRAVENOUS | Status: AC | PRN
  Administered 2018-09-14: 75 mL via INTRAVENOUS

## 2018-09-14 MED ORDER — PREDNISONE 20 MG PO TABS: 40.0000 mg | ORAL_TABLET | Freq: Every day | ORAL | 0 refills | Status: AC

## 2018-09-14 NOTE — ED Triage Notes (Signed)
Pt c/o left sided chest/rib pain that radiates to the back that began last night along with some sob ; pt states that he has had this similar pain before and it was a blood clot   150/100 96% on RA  72 HR  SR [phone] Joelene Millin, wife

## 2018-09-14 NOTE — ED Notes (Signed)
Patient verbalizes understanding of discharge instructions. Opportunity for questioning and answering were provided. Armband removed by staff , patient discharged from ED. Wife here to pick patient up

## 2018-09-14 NOTE — Discharge Instructions (Signed)
Your CT angio was normal today. I have prescribed steroids to help with your shortness of breath. Please know this medication can cause flushness, insomnia, changes in appetite.  Please follow-up with your primary care physician as needed.

## 2018-09-14 NOTE — ED Provider Notes (Signed)
Medical screening examination/treatment/procedure(s) were conducted as a shared visit with non-physician practitioner(s) and myself.  I personally evaluated the patient during the encounter.  Clinical Impression:   Final diagnoses:  Chest wall pain  Shortness of breath     The patient is a 52 year old male, known history of COPD as well as a history of pulmonary embolism.  I personally reviewed the medical record including viewing the images from the CT angiogram that demonstrated the left-sided pulmonary embolism.  This was unprovoked, he did have some residual clot in his lower venous system at that time.  He has been on Eliquis since, doing well until recently when he started to develop a sharp left-sided pain around his left rib cage similar in location to his prior PE.  On exam the patient is slightly short of breath, mildly tachypneic, lung sounds are equal and diminished bilaterally.  He does have some slight expiratory wheezing.  Otherwise speaking in full sentences without tachycardia or significant hypoxia.  He is about 95% on no oxygen, 97 to 98% on 1 L.  Chest x-ray showed possible lingular infiltrate however given prior history of PE and this pleuritic type pain will need CT angiogram to confirm nothing else.  ..  EKG Interpretation  Date/Time:  Wednesday September 14 2018 09:19:10 EDT Ventricular Rate:  64 PR Interval:    QRS Duration: 100 QT Interval:  395 QTC Calculation: 408 R Axis:   83 Text Interpretation:  Sinus rhythm ST elev, probable normal early repol pattern since 2005, no significant changes Confirmed by Noemi Chapel [phone]) on 09/14/2018 10:48:16 AM         Noemi Chapel, MD 09/15/18 [phone]

## 2018-09-14 NOTE — ED Notes (Signed)
Urine culture collected and sent down to main lab 

## 2018-09-14 NOTE — ED Provider Notes (Signed)
[facility] EMERGENCY DEPARTMENT Provider Note   CSN: [id number] Arrival date & time: 09/14/18  0908    History   Chief Complaint Chief Complaint  Patient presents with  . Chest Pain    HPI Jason Barajas is a 53 y.o. male.      53 y.o male with a PMH of DVT, GERD, severe COPD presents to the ED via EMS with a chief complaint of left lower diaphragm pain times yesterday.  Patient reports he began feeling a pain along the left lower diaphragm side yesterday, reports he thought he was laying on the wrong side, states his pain felt worse when he rolled on his back.  He describes a similar episode in the past when he had a previous blood clot several years ago.  He has not taken any medication for relieving symptoms.  Patient also endorses shortness of breath, does have a previous history of COPD, states he uses his inhalers along with nebulizers, has been more short of breath than usual lately.  During arrival patient was satting at 94%, not on any home oxygen.  Denies any fevers, weakness, headaches, or other complaints. Patient is currently on Eliquis 2.5 twice daily, reports compliance with this medication.  The history is provided by the patient.    Past Medical History:  Diagnosis Date  . Allergy   . Anxiety   . Clotting disorder ([place])   . Colon polyps   . COPD, severe ([place])   . Depression   . DVT (deep venous thrombosis) ([place])   . GERD (gastroesophageal reflux disease)   . H/O blood clots 04/2016  . History of anal fissures   . Hyperlipidemia   . Hypertension   . IBS (irritable bowel syndrome)   . Pulmonary embolism [facility])     Patient Active Problem List   Diagnosis Date Noted  . SMOKER 05/01/2008  . PULMONARY NODULE 05/01/2008  . COPD UNSPECIFIED 03/16/2008    Past Surgical History:  Procedure Laterality Date  . CHOLECYSTECTOMY    . COLONOSCOPY  04/01/2016   Significant colonic polyps status post polypectomy. Minimal sig,oid diverticulosis.  Tubular adenomas.   Marland Kitchen POLYPECTOMY    . TONSILLECTOMY AND ADENOIDECTOMY    . UPPER GASTROINTESTINAL ENDOSCOPY    . WISDOM TOOTH EXTRACTION          Home Medications    Prior to Admission medications   Medication Sig Start Date End Date Taking? Authorizing Provider  albuterol (PROVENTIL HFA;VENTOLIN HFA) 108 (90 Base) MCG/ACT inhaler Inhale 2 puffs into the lungs as needed.   Yes [provider]  albuterol (PROVENTIL) (2.5 MG/3ML) 0.083% nebulizer solution  07/31/17  Yes [provider]  alprazolam Duanne Moron) 2 MG tablet Take 2 mg by mouth 2 (two) times daily as needed for anxiety. Took 1/2 tablet (1mg ) today 09/18/17  Yes [provider]  budesonide-formoterol (SYMBICORT) 160-4.5 MCG/ACT inhaler Inhale 2 puffs into the lungs 2 (two) times daily.   Yes [provider]  citalopram (CELEXA) 40 MG tablet Take 40 mg by mouth daily. 09/18/17  Yes [provider]  ELIQUIS 2.5 MG TABS tablet Take 2.5 mg by mouth 2 (two) times daily.  07/19/17  Yes [provider]  gabapentin (NEURONTIN) 300 MG capsule Take 600 mg by mouth as needed.   Yes [provider]  olmesartan-hydrochlorothiazide (BENICAR HCT) 20-12.5 MG tablet Take 1 tablet by mouth daily. 09/17/17  Yes [provider]  simvastatin (ZOCOR) 20 MG tablet Take 20 mg  by mouth daily. 08/19/17  Yes [provider]  predniSONE (DELTASONE) 20 MG tablet Take 2 tablets (40 mg total) by mouth daily for 5 days. 09/14/18 09/19/18  Janeece Fitting, PA-C    Family History Family History  Problem Relation Age of Onset  . Colon cancer Father        part of colon removed- not sure why  . Esophageal cancer Neg Hx   . Rectal cancer Neg Hx   . Stomach cancer Neg Hx     Social History Social History   Tobacco Use  . Smoking status: Current Every Day Smoker    Packs/day: 1.00    Types: Cigarettes  . Smokeless tobacco: Never Used  Substance Use Topics  . Alcohol use: Not Currently   . Drug use: Never     Allergies   Penicillins and Codeine   Review of Systems Review of Systems  Constitutional: Negative for chills and fever.  HENT: Negative for ear pain and sore throat.   Eyes: Negative for pain and visual disturbance.  Respiratory: Positive for shortness of breath and wheezing. Negative for cough.   Cardiovascular: Positive for chest pain. Negative for palpitations.  Gastrointestinal: Negative for abdominal pain and vomiting.  Genitourinary: Negative for dysuria and hematuria.  Musculoskeletal: Negative for arthralgias and back pain.  Skin: Negative for color change and rash.  Neurological: Negative for seizures and syncope.  All other systems reviewed and are negative.    Physical Exam Updated Vital Signs BP 129/69 (BP Location: Left Arm)   Pulse 74   Temp 98.2 F (36.8 C) (Oral)   Resp 18   SpO2 91%   Physical Exam Vitals signs and nursing note reviewed.  Constitutional:      Appearance: He is well-developed.  HENT:     Head: Normocephalic and atraumatic.  Eyes:     General: No scleral icterus.    Pupils: Pupils are equal, round, and reactive to light.  Neck:     Musculoskeletal: Normal range of motion.  Cardiovascular:     Heart sounds: Normal heart sounds.  Pulmonary:     Effort: Pulmonary effort is normal.     Breath sounds: Examination of the right-middle field reveals decreased breath sounds. Examination of the right-lower field reveals wheezing. Examination of the left-lower field reveals decreased breath sounds. Decreased breath sounds and wheezing present. No rhonchi or rales.  Chest:     Chest wall: No tenderness.  Abdominal:     General: Bowel sounds are normal. There is no distension.     Palpations: Abdomen is soft.     Tenderness: There is no abdominal tenderness.  Musculoskeletal:        General: No tenderness or deformity.  Skin:    General: Skin is warm and dry.  Neurological:     Mental Status: He is alert and  oriented to person, place, and time.      ED Treatments / Results  Labs (all labs ordered are listed, but only abnormal results are displayed) Labs Reviewed  CBC WITH DIFFERENTIAL/PLATELET - Abnormal; Notable for the following components:      Result Value   Abs Immature Granulocytes 0.09 (*)    All other components within normal limits  COMPREHENSIVE METABOLIC PANEL - Abnormal; Notable for the following components:   Glucose, Bld 100 (*)    All other components within normal limits  URINALYSIS, ROUTINE W REFLEX MICROSCOPIC  TROPONIN I (HIGH SENSITIVITY)  TROPONIN I (HIGH SENSITIVITY)    EKG  EKG Interpretation  Date/Time:  Wednesday September 14 2018 09:19:10 EDT Ventricular Rate:  64 PR Interval:    QRS Duration: 100 QT Interval:  395 QTC Calculation: 408 R Axis:   83 Text Interpretation:  Sinus rhythm ST elev, probable normal early repol pattern since 2005, no significant changes Confirmed by Noemi Chapel [phone]) on 09/14/2018 10:48:16 AM   Radiology Ct Angio Chest Pe W And/or Wo Contrast  Result Date: 09/14/2018 CLINICAL DATA:  Chest pain, shortness of breath, history of PE and DVT EXAM: CT ANGIOGRAPHY CHEST WITH CONTRAST TECHNIQUE: Multidetector CT imaging of the chest was performed using the standard protocol during bolus administration of intravenous contrast. Multiplanar CT image reconstructions and MIPs were obtained to evaluate the vascular anatomy. CONTRAST:  2mL OMNIPAQUE IOHEXOL 350 MG/ML SOLN COMPARISON:  05/08/2016 FINDINGS: Cardiovascular: Satisfactory opacification of the pulmonary arteries to the segmental level. No evidence of pulmonary embolism. Normal heart size. Left coronary artery calcifications. No pericardial effusion. Mediastinum/Nodes: No enlarged mediastinal, hilar, or axillary lymph nodes. Thyroid gland, trachea, and esophagus demonstrate no significant findings. Lungs/Pleura: Lungs are clear. Redemonstrated bullous emphysema of the medial left upper  lobe. Mild centrilobular emphysema otherwise. No pleural effusion or pneumothorax. Upper Abdomen: No acute abnormality. Musculoskeletal: No chest wall abnormality. No acute or significant osseous findings. Review of the MIP images confirms the above findings. IMPRESSION: 1.  Negative examination for pulmonary embolism. 2.  Emphysema. 3.  Coronary artery disease. Electronically Signed   By: Eddie Candle M.D.   On: 09/14/2018 13:53   Dg Chest Portable 1 View  Result Date: 09/14/2018 CLINICAL DATA:  Short of breath EXAM: PORTABLE CHEST 1 VIEW COMPARISON:  07/12/2017 FINDINGS: The heart size and mediastinal contours are within normal limits. Both lungs are clear. The visualized skeletal structures are unremarkable. IMPRESSION: No active disease. Electronically Signed   By: Franchot Gallo M.D.   On: 09/14/2018 10:15    Procedures Procedures (including critical care time)  Medications Ordered in ED Medications  ipratropium (ATROVENT HFA) inhaler 4 puff (4 puffs Inhalation Given 09/14/18 1202)  predniSONE (DELTASONE) tablet 60 mg (60 mg Oral Given 09/14/18 1202)  iohexol (OMNIPAQUE) 350 MG/ML injection 75 mL (75 mLs Intravenous Contrast Given 09/14/18 1327)     Initial Impression / Assessment and Plan / ED Course  I have reviewed the triage vital signs and the nursing notes.  Pertinent labs & imaging results that were available during my care of the patient were reviewed by me and considered in my medical decision making (see chart for details).    Patient with a past medical history of COPD presents to the ED via EMS with increasing shortness of breath, left upper quadrant pain, reports this feels similar as his previous episode when he had a previous blood clot.  Patient is currently on Eliquis 2.5 mg twice daily, reports compliance with this.  Differential diagnosis included but not limited to COPD exacerbation versus pulmonary embolism versus pneumonia.  Low suspicion for ACS as patient reports  more so rib pain worse with inspiration more of a pleuritic presentation.  Patient does have a previous history of an unprovoked pulmonary embolism, was placed on anticoagulation therapy.  Today he reports the same pain along his left side.  Has x-ray showed no acute process such as consolidation or pneumothorax.  Unable to fully rule out pulmonary embolism will obtain a CT angios PE to further evaluate.  Patient is also been given Decadron, ipratropium inhaler treatment.  CBC showed no leukocytosis, hemoglobin is  within normal limits.  CMP showed no electrolyte abnormality, creatinine level is unremarkable.  LFTs are within normal limits.  UA was unremarkable.  Vital signs are stable, saturation at 97% on room air.  1:10 PM Spoke to wife Joelene Millin, who confirmed patient's previous blood clot was unprovoked.  She also reports he had a different blood clot which he was told was resolving.  Wife denies any fevers while at home.  CT Angio showed: 1. Negative examination for pulmonary embolism.    2. Emphysema.    3. Coronary artery disease.   Discussed results with patient, he will be going home with a short course of steroids, suspect this pain is likely coming from COPD related exacerbation.  No blood clot.  We will have him follow-up with PCP as needed.  2:50 PM spoke to wife Atha Starks, discussed results of the CT with her.  She does report having a pulse ox at home, she will continue to monitor her husband if any worsening symptoms she will have husband return to the emergency department.  Discussed risks and benefits of therapy with patient.  Vitals are within normal limits.  Return precautions discussed at length.  Portions of this note were generated with [employer]. Dictation errors may occur despite best attempts at proofreading.    Final Clinical Impressions(s) / ED Diagnoses   Final diagnoses:  Chest wall pain  Shortness of breath    ED Discharge Orders          Ordered    predniSONE (DELTASONE) 20 MG tablet  Daily     09/14/18 1453           Janeece Fitting, PA-C 09/14/18 1456    Noemi Chapel, MD 09/15/18 [phone]

## 2018-12-12 DIAGNOSIS — E782 Mixed hyperlipidemia: Secondary | ICD-10-CM | POA: Diagnosis not present

## 2018-12-12 DIAGNOSIS — Z23 Encounter for immunization: Secondary | ICD-10-CM | POA: Diagnosis not present

## 2018-12-12 DIAGNOSIS — S51819A Laceration without foreign body of unspecified forearm, initial encounter: Secondary | ICD-10-CM | POA: Diagnosis not present

## 2018-12-12 DIAGNOSIS — Z79899 Other long term (current) drug therapy: Secondary | ICD-10-CM | POA: Diagnosis not present

## 2018-12-12 DIAGNOSIS — Z86711 Personal history of pulmonary embolism: Secondary | ICD-10-CM | POA: Diagnosis not present

## 2018-12-30 DIAGNOSIS — J309 Allergic rhinitis, unspecified: Secondary | ICD-10-CM | POA: Diagnosis not present

## 2018-12-30 DIAGNOSIS — J449 Chronic obstructive pulmonary disease, unspecified: Secondary | ICD-10-CM | POA: Diagnosis not present

## 2018-12-30 DIAGNOSIS — Z86711 Personal history of pulmonary embolism: Secondary | ICD-10-CM | POA: Diagnosis not present

## 2019-01-02 ENCOUNTER — Encounter: Payer: Self-pay | Admitting: Gastroenterology

## 2019-01-09 DIAGNOSIS — R221 Localized swelling, mass and lump, neck: Secondary | ICD-10-CM | POA: Diagnosis not present

## 2019-04-07 DIAGNOSIS — M5432 Sciatica, left side: Secondary | ICD-10-CM | POA: Diagnosis not present

## 2019-11-15 DIAGNOSIS — Z0000 Encounter for general adult medical examination without abnormal findings: Secondary | ICD-10-CM | POA: Diagnosis not present

## 2019-11-15 DIAGNOSIS — R6889 Other general symptoms and signs: Secondary | ICD-10-CM | POA: Diagnosis not present

## 2019-11-15 DIAGNOSIS — E782 Mixed hyperlipidemia: Secondary | ICD-10-CM | POA: Diagnosis not present

## 2019-11-15 DIAGNOSIS — I10 Essential (primary) hypertension: Secondary | ICD-10-CM | POA: Diagnosis not present

## 2019-12-01 ENCOUNTER — Emergency Department (HOSPITAL_COMMUNITY): Payer: Medicare Other

## 2019-12-01 ENCOUNTER — Other Ambulatory Visit: Payer: Self-pay

## 2019-12-01 ENCOUNTER — Encounter (HOSPITAL_COMMUNITY): Payer: Self-pay | Admitting: *Deleted

## 2019-12-01 ENCOUNTER — Inpatient Hospital Stay (HOSPITAL_COMMUNITY)
Admission: EM | Admit: 2019-12-01 | Discharge: 2019-12-12 | DRG: 071 | Disposition: A | Discharge status: Home Health | Payer: Medicare Other | Attending: Internal Medicine | Admitting: Internal Medicine

## 2019-12-01 DIAGNOSIS — I6522 Occlusion and stenosis of left carotid artery: Secondary | ICD-10-CM | POA: Diagnosis present

## 2019-12-01 DIAGNOSIS — J42 Unspecified chronic bronchitis: Secondary | ICD-10-CM | POA: Diagnosis not present

## 2019-12-01 DIAGNOSIS — J439 Emphysema, unspecified: Secondary | ICD-10-CM | POA: Diagnosis not present

## 2019-12-01 DIAGNOSIS — I639 Cerebral infarction, unspecified: Secondary | ICD-10-CM

## 2019-12-01 DIAGNOSIS — Z6833 Body mass index (BMI) 33.0-33.9, adult: Secondary | ICD-10-CM

## 2019-12-01 DIAGNOSIS — E669 Obesity, unspecified: Secondary | ICD-10-CM | POA: Diagnosis present

## 2019-12-01 DIAGNOSIS — R0902 Hypoxemia: Secondary | ICD-10-CM | POA: Diagnosis present

## 2019-12-01 DIAGNOSIS — Z20822 Contact with and (suspected) exposure to covid-19: Secondary | ICD-10-CM | POA: Diagnosis present

## 2019-12-01 DIAGNOSIS — R410 Disorientation, unspecified: Secondary | ICD-10-CM | POA: Diagnosis not present

## 2019-12-01 DIAGNOSIS — E872 Acidosis: Secondary | ICD-10-CM | POA: Diagnosis present

## 2019-12-01 DIAGNOSIS — S199XXA Unspecified injury of neck, initial encounter: Secondary | ICD-10-CM | POA: Diagnosis not present

## 2019-12-01 DIAGNOSIS — W19XXXA Unspecified fall, initial encounter: Secondary | ICD-10-CM | POA: Diagnosis not present

## 2019-12-01 DIAGNOSIS — Z8719 Personal history of other diseases of the digestive system: Secondary | ICD-10-CM

## 2019-12-01 DIAGNOSIS — F17210 Nicotine dependence, cigarettes, uncomplicated: Secondary | ICD-10-CM | POA: Diagnosis present

## 2019-12-01 DIAGNOSIS — F419 Anxiety disorder, unspecified: Secondary | ICD-10-CM | POA: Diagnosis not present

## 2019-12-01 DIAGNOSIS — Z743 Need for continuous supervision: Secondary | ICD-10-CM | POA: Diagnosis not present

## 2019-12-01 DIAGNOSIS — Y92009 Unspecified place in unspecified non-institutional (private) residence as the place of occurrence of the external cause: Secondary | ICD-10-CM

## 2019-12-01 DIAGNOSIS — D689 Coagulation defect, unspecified: Secondary | ICD-10-CM | POA: Diagnosis not present

## 2019-12-01 DIAGNOSIS — Z7901 Long term (current) use of anticoagulants: Secondary | ICD-10-CM

## 2019-12-01 DIAGNOSIS — G9341 Metabolic encephalopathy: Secondary | ICD-10-CM | POA: Diagnosis not present

## 2019-12-01 DIAGNOSIS — R29735 NIHSS score 35: Secondary | ICD-10-CM | POA: Diagnosis not present

## 2019-12-01 DIAGNOSIS — R0602 Shortness of breath: Secondary | ICD-10-CM | POA: Diagnosis not present

## 2019-12-01 DIAGNOSIS — I6523 Occlusion and stenosis of bilateral carotid arteries: Secondary | ICD-10-CM | POA: Diagnosis not present

## 2019-12-01 DIAGNOSIS — R269 Unspecified abnormalities of gait and mobility: Secondary | ICD-10-CM | POA: Diagnosis present

## 2019-12-01 DIAGNOSIS — I10 Essential (primary) hypertension: Secondary | ICD-10-CM | POA: Diagnosis not present

## 2019-12-01 DIAGNOSIS — E785 Hyperlipidemia, unspecified: Secondary | ICD-10-CM | POA: Diagnosis not present

## 2019-12-01 DIAGNOSIS — Z800 Family history of malignant neoplasm of digestive organs: Secondary | ICD-10-CM | POA: Diagnosis not present

## 2019-12-01 DIAGNOSIS — R918 Other nonspecific abnormal finding of lung field: Secondary | ICD-10-CM | POA: Diagnosis not present

## 2019-12-01 DIAGNOSIS — R471 Dysarthria and anarthria: Secondary | ICD-10-CM | POA: Diagnosis present

## 2019-12-01 DIAGNOSIS — R4182 Altered mental status, unspecified: Secondary | ICD-10-CM | POA: Diagnosis present

## 2019-12-01 DIAGNOSIS — Z23 Encounter for immunization: Secondary | ICD-10-CM | POA: Diagnosis not present

## 2019-12-01 DIAGNOSIS — F13231 Sedative, hypnotic or anxiolytic dependence with withdrawal delirium: Secondary | ICD-10-CM | POA: Diagnosis not present

## 2019-12-01 DIAGNOSIS — Z880 Allergy status to penicillin: Secondary | ICD-10-CM

## 2019-12-01 DIAGNOSIS — G9340 Encephalopathy, unspecified: Secondary | ICD-10-CM

## 2019-12-01 DIAGNOSIS — K219 Gastro-esophageal reflux disease without esophagitis: Secondary | ICD-10-CM | POA: Diagnosis not present

## 2019-12-01 DIAGNOSIS — J449 Chronic obstructive pulmonary disease, unspecified: Secondary | ICD-10-CM | POA: Diagnosis present

## 2019-12-01 DIAGNOSIS — J438 Other emphysema: Secondary | ICD-10-CM | POA: Diagnosis present

## 2019-12-01 DIAGNOSIS — F32A Depression, unspecified: Secondary | ICD-10-CM | POA: Diagnosis not present

## 2019-12-01 DIAGNOSIS — R6889 Other general symptoms and signs: Secondary | ICD-10-CM | POA: Diagnosis not present

## 2019-12-01 DIAGNOSIS — Z86718 Personal history of other venous thrombosis and embolism: Secondary | ICD-10-CM

## 2019-12-01 DIAGNOSIS — R4020 Unspecified coma: Secondary | ICD-10-CM | POA: Diagnosis not present

## 2019-12-01 DIAGNOSIS — Z86711 Personal history of pulmonary embolism: Secondary | ICD-10-CM | POA: Diagnosis not present

## 2019-12-01 DIAGNOSIS — Z7951 Long term (current) use of inhaled steroids: Secondary | ICD-10-CM | POA: Diagnosis not present

## 2019-12-01 DIAGNOSIS — R278 Other lack of coordination: Secondary | ICD-10-CM | POA: Diagnosis present

## 2019-12-01 DIAGNOSIS — F410 Panic disorder [episodic paroxysmal anxiety] without agoraphobia: Secondary | ICD-10-CM | POA: Diagnosis present

## 2019-12-01 DIAGNOSIS — Z885 Allergy status to narcotic agent status: Secondary | ICD-10-CM

## 2019-12-01 DIAGNOSIS — R29818 Other symptoms and signs involving the nervous system: Secondary | ICD-10-CM | POA: Diagnosis not present

## 2019-12-01 DIAGNOSIS — K589 Irritable bowel syndrome without diarrhea: Secondary | ICD-10-CM | POA: Diagnosis not present

## 2019-12-01 DIAGNOSIS — Z79899 Other long term (current) drug therapy: Secondary | ICD-10-CM | POA: Diagnosis not present

## 2019-12-01 DIAGNOSIS — R569 Unspecified convulsions: Secondary | ICD-10-CM | POA: Diagnosis not present

## 2019-12-01 DIAGNOSIS — R404 Transient alteration of awareness: Secondary | ICD-10-CM | POA: Diagnosis not present

## 2019-12-01 DIAGNOSIS — Z9049 Acquired absence of other specified parts of digestive tract: Secondary | ICD-10-CM

## 2019-12-01 DIAGNOSIS — F13939 Sedative, hypnotic or anxiolytic use, unspecified with withdrawal, unspecified: Secondary | ICD-10-CM

## 2019-12-01 DIAGNOSIS — R55 Syncope and collapse: Secondary | ICD-10-CM | POA: Diagnosis not present

## 2019-12-01 DIAGNOSIS — J80 Acute respiratory distress syndrome: Secondary | ICD-10-CM | POA: Diagnosis not present

## 2019-12-01 DIAGNOSIS — J3489 Other specified disorders of nose and nasal sinuses: Secondary | ICD-10-CM | POA: Diagnosis not present

## 2019-12-01 DIAGNOSIS — F13239 Sedative, hypnotic or anxiolytic dependence with withdrawal, unspecified: Secondary | ICD-10-CM | POA: Diagnosis present

## 2019-12-01 DIAGNOSIS — R401 Stupor: Secondary | ICD-10-CM | POA: Diagnosis not present

## 2019-12-01 LAB — URINALYSIS, ROUTINE W REFLEX MICROSCOPIC
Bacteria, UA: NONE SEEN
Bilirubin Urine: NEGATIVE
Glucose, UA: NEGATIVE mg/dL
Ketones, ur: NEGATIVE mg/dL
Leukocytes,Ua: NEGATIVE
Nitrite: NEGATIVE
Protein, ur: NEGATIVE mg/dL
Specific Gravity, Urine: 1.034 — ABNORMAL HIGH (ref 1.005–1.030)
pH: 5 (ref 5.0–8.0)

## 2019-12-01 LAB — CBC
HCT: 46.2 % (ref 39.0–52.0)
Hemoglobin: 14.7 g/dL (ref 13.0–17.0)
MCH: 31.3 pg (ref 26.0–34.0)
MCHC: 31.8 g/dL (ref 30.0–36.0)
MCV: 98.3 fL (ref 80.0–100.0)
Platelets: 187 10*3/uL (ref 150–400)
RBC: 4.7 MIL/uL (ref 4.22–5.81)
RDW: 13.3 % (ref 11.5–15.5)
WBC: 8.3 10*3/uL (ref 4.0–10.5)
nRBC: 0 % (ref 0.0–0.2)

## 2019-12-01 LAB — DIFFERENTIAL
Abs Immature Granulocytes: 0.07 10*3/uL (ref 0.00–0.07)
Basophils Absolute: 0.1 10*3/uL (ref 0.0–0.1)
Basophils Relative: 1 %
Eosinophils Absolute: 0.3 10*3/uL (ref 0.0–0.5)
Eosinophils Relative: 4 %
Immature Granulocytes: 1 %
Lymphocytes Relative: 28 %
Lymphs Abs: 2.3 10*3/uL (ref 0.7–4.0)
Monocytes Absolute: 0.7 10*3/uL (ref 0.1–1.0)
Monocytes Relative: 9 %
Neutro Abs: 4.8 10*3/uL (ref 1.7–7.7)
Neutrophils Relative %: 57 %

## 2019-12-01 LAB — I-STAT ARTERIAL BLOOD GAS, ED
Acid-Base Excess: 2 mmol/L (ref 0.0–2.0)
Bicarbonate: 29.2 mmol/L — ABNORMAL HIGH (ref 20.0–28.0)
Calcium, Ion: 1.16 mmol/L (ref 1.15–1.40)
HCT: 42 % (ref 39.0–52.0)
Hemoglobin: 14.3 g/dL (ref 13.0–17.0)
O2 Saturation: 88 %
Patient temperature: 98.6
Potassium: 3.8 mmol/L (ref 3.5–5.1)
Sodium: 137 mmol/L (ref 135–145)
TCO2: 31 mmol/L (ref 22–32)
pCO2 arterial: 54.7 mmHg — ABNORMAL HIGH (ref 32.0–48.0)
pH, Arterial: 7.335 — ABNORMAL LOW (ref 7.350–7.450)
pO2, Arterial: 60 mmHg — ABNORMAL LOW (ref 83.0–108.0)

## 2019-12-01 LAB — RESPIRATORY PANEL BY RT PCR (FLU A&B, COVID)
Influenza A by PCR: NEGATIVE
Influenza B by PCR: NEGATIVE
SARS Coronavirus 2 by RT PCR: NEGATIVE

## 2019-12-01 LAB — I-STAT CHEM 8, ED
BUN: 9 mg/dL (ref 6–20)
Calcium, Ion: 1.03 mmol/L — ABNORMAL LOW (ref 1.15–1.40)
Chloride: 99 mmol/L (ref 98–111)
Creatinine, Ser: 0.8 mg/dL (ref 0.61–1.24)
Glucose, Bld: 76 mg/dL (ref 70–99)
HCT: 45 % (ref 39.0–52.0)
Hemoglobin: 15.3 g/dL (ref 13.0–17.0)
Potassium: 3.8 mmol/L (ref 3.5–5.1)
Sodium: 137 mmol/L (ref 135–145)
TCO2: 27 mmol/L (ref 22–32)

## 2019-12-01 LAB — COMPREHENSIVE METABOLIC PANEL
ALT: 15 U/L (ref 0–44)
AST: 15 U/L (ref 15–41)
Albumin: 3.5 g/dL (ref 3.5–5.0)
Alkaline Phosphatase: 54 U/L (ref 38–126)
Anion gap: 12 (ref 5–15)
BUN: 8 mg/dL (ref 6–20)
CO2: 23 mmol/L (ref 22–32)
Calcium: 8.5 mg/dL — ABNORMAL LOW (ref 8.9–10.3)
Chloride: 101 mmol/L (ref 98–111)
Creatinine, Ser: 0.77 mg/dL (ref 0.61–1.24)
GFR calc Af Amer: >60 mL/min (ref >60)
GFR calc non Af Amer: >60 mL/min (ref >60)
Glucose, Bld: 78 mg/dL (ref 70–99)
Potassium: 3.9 mmol/L (ref 3.5–5.1)
Sodium: 136 mmol/L (ref 135–145)
Total Bilirubin: 0.8 mg/dL (ref 0.3–1.2)
Total Protein: 6.1 g/dL — ABNORMAL LOW (ref 6.5–8.1)

## 2019-12-01 LAB — CBG MONITORING, ED: Glucose-Capillary: 76 mg/dL (ref 70–99)

## 2019-12-01 LAB — APTT: aPTT: 28 seconds (ref 24–36)

## 2019-12-01 LAB — RAPID URINE DRUG SCREEN, HOSP PERFORMED
Amphetamines: NOT DETECTED
Barbiturates: NOT DETECTED
Benzodiazepines: POSITIVE — AB
Cocaine: NOT DETECTED
Opiates: NOT DETECTED
Tetrahydrocannabinol: NOT DETECTED

## 2019-12-01 LAB — ETHANOL: Alcohol, Ethyl (B): <10 mg/dL (ref <10)

## 2019-12-01 LAB — PROTIME-INR
INR: 1.1 (ref 0.8–1.2)
Prothrombin Time: 13.3 seconds (ref 11.4–15.2)

## 2019-12-01 MED ORDER — SODIUM CHLORIDE 0.9 % IV BOLUS
1000.0000 mL | Freq: Once | INTRAVENOUS | Status: AC
  Administered 2019-12-01: 1000 mL via INTRAVENOUS

## 2019-12-01 MED ORDER — SODIUM CHLORIDE 0.9% FLUSH
3.0000 mL | Freq: Once | INTRAVENOUS | Status: AC
  Administered 2019-12-01: 3 mL via INTRAVENOUS

## 2019-12-01 MED ORDER — IOHEXOL 350 MG/ML SOLN
75.0000 mL | Freq: Once | INTRAVENOUS | Status: AC | PRN
  Administered 2019-12-01: 75 mL via INTRAVENOUS

## 2019-12-01 NOTE — ED Triage Notes (Signed)
Pt arrives via [facility] from home. Unwitnessed fall around 1700, pt was able to get back up on his own at that time. Witnessed, "unresponsive" fall around 1800. On EMS arrival, pupils pinpoint, then dilated and fixed. Code stroke called in the field  Pt arrived back from [place] with Maudie Mercury, RN, reports pt has not followed commands during CT, rigid in upper and lower extremities with movement. Sats 91%on room air in CT. Pt wife is in Family room.

## 2019-12-01 NOTE — ED Notes (Signed)
Resp. Tech called for ABG

## 2019-12-01 NOTE — ED Notes (Signed)
Patient transported to MRI 

## 2019-12-01 NOTE — H&P (Addendum)
History and Physical    Jason Barajas AST:[id number] DOB: [birth year] DOA: 12/01/2019  PCP: Ronita Hipps, MD Patient coming from: Home  Chief Complaint: Altered mental status  HPI: Jason Barajas is a 54 y.o. male with medical history significant of anxiety, depression, COPD, PE/DVT on Eliquis, GERD, hypertension, hyperlipidemia, IBS presenting to the ED with altered mental status. Patient is very somnolent and no history could be obtained from him. History provided by wife at bedside who states that yesterday patient had an unwitnessed fall. Then again today he had another unwitnessed fall. States this evening patient was acting normal and talking to her and another neighbor. Then all of a sudden his speech became slurred and he became unresponsive.  His eyes were still open and he did not lose consciousness.  He takes Xanax 1 mg up to 5 times a day for anxiety.  He also takes gabapentin for panic attacks.  States she fill out his pillbox and keeps the rest of the medication with her. She is not sure how much Xanax he took today. Wife states patient has depression for which he takes citalopram and is seen by a psychiatrist. States patient has not expressed any suicidal thoughts. States he has chronic shortness of breath due to COPD but has not had any fevers, cough, nausea, vomiting, or diarrhea.  He has not complained of any abdominal pain.  Wife confirms that he does not drink any alcohol.  He has been vaccinated against Covid.  ED Course: VSS. Initially code stroke was activated and patient was seen by neurology.  TPA not given as he is on Eliquis.  Head CT normal.  CT angiogram head and neck negative for LVO.  Brain MRI normal.  WBC 8.3, hemoglobin 14.7, hematocrit 46.2, platelet 187K.  Sodium 136, potassium 3.9, chloride 101, bicarb 23, BUN 8, creatinine 0.7, glucose 78.  LFTs normal.  INR 1.1.  UA not suggestive of infection.  UDS positive for benzodiazepines (patient takes Xanax at home).   SARS-CoV-2 PCR test negative.  Influenza panel negative.  Blood ethanol level undetectable.  Salicylate and acetaminophen levels pending.  Chest x-ray negative for acute cardiopulmonary abnormality.  CT C-spine negative for acute osseous abnormality.  EKG without acute changes.  ABG with pH 7.33, PCO2 54, PO2 60.  He was placed on BiPAP briefly which did not improve his mental status.  Patient received 1 L IV fluid bolus.  Review of Systems:  All systems reviewed and apart from history of presenting illness, are negative.  Past Medical History:  Diagnosis Date  . Allergy   . Anxiety   . Clotting disorder ([place])   . Colon polyps   . COPD, severe ([place])   . Depression   . DVT (deep venous thrombosis) ([place])   . GERD (gastroesophageal reflux disease)   . H/O blood clots 04/2016  . History of anal fissures   . Hyperlipidemia   . Hypertension   . IBS (irritable bowel syndrome)   . Pulmonary embolism [facility])     Past Surgical History:  Procedure Laterality Date  . CHOLECYSTECTOMY    . COLONOSCOPY  04/01/2016   Significant colonic polyps status post polypectomy. Minimal sig,oid diverticulosis. Tubular adenomas.   Marland Kitchen POLYPECTOMY    . TONSILLECTOMY AND ADENOIDECTOMY    . UPPER GASTROINTESTINAL ENDOSCOPY    . WISDOM TOOTH EXTRACTION       reports that he has been smoking cigarettes. He has been smoking about 1.00 pack per day. He has  never used smokeless tobacco. He reports previous alcohol use. He reports that he does not use drugs.  Allergies  Allergen Reactions  . Penicillins Other (See Comments)    Unknown childhood reaction  . Codeine Rash    Family History  Problem Relation Age of Onset  . Colon cancer Father        part of colon removed- not sure why  . Esophageal cancer Neg Hx   . Rectal cancer Neg Hx   . Stomach cancer Neg Hx     Prior to Admission medications   Medication Sig Start Date End Date Taking? Authorizing Provider  acetaminophen (TYLENOL) 500 MG tablet  Take 500 mg by mouth every 6 (six) hours as needed for headache (pain).   Yes [provider]  albuterol (PROAIR HFA) 108 (90 Base) MCG/ACT inhaler Inhale 2 puffs into the lungs every 6 (six) hours as needed for wheezing or shortness of breath.   Yes [provider]  albuterol (PROVENTIL) (2.5 MG/3ML) 0.083% nebulizer solution Take 2.5 mg by nebulization every 4 (four) hours as needed for wheezing or shortness of breath.  07/31/17  Yes [provider]  ALPRAZolam Duanne Moron) 1 MG tablet Take 1 mg by mouth 5 (five) times daily. 11/13/19  Yes [provider]  apixaban (ELIQUIS) 2.5 MG TABS tablet Take 2.5 mg by mouth 2 (two) times daily.   Yes [provider]  budesonide-formoterol (SYMBICORT) 160-4.5 MCG/ACT inhaler Inhale 2 puffs into the lungs 2 (two) times daily.   Yes [provider]  citalopram (CELEXA) 40 MG tablet Take 20-40 mg by mouth See admin instructions. Take 1/2 tablet (20 mg) by mouth every morning and 1 tablet (40 mg) at night 09/18/17  Yes [provider]  gabapentin (NEURONTIN) 600 MG tablet Take 600 mg by mouth 2 (two) times daily as needed (panic attacks).  10/13/19  Yes [provider]  Naphazoline HCl (CLEAR EYES OP) Place 1 drop into both eyes daily as needed (dry eyes/irritation).   Yes [provider]  olmesartan-hydrochlorothiazide (BENICAR HCT) 20-12.5 MG tablet Take 1 tablet by mouth daily. 09/17/17  Yes [provider]  pantoprazole (PROTONIX) 40 MG tablet Take 40 mg by mouth daily. 11/15/19  Yes [provider]  simvastatin (ZOCOR) 20 MG tablet Take 20 mg by mouth daily. 08/19/17  Yes [provider]    Physical Exam: Vitals:   12/02/19 0015 12/02/19 0030 12/02/19 0100 12/02/19 0145  BP: 117/70 117/75 112/63 109/71  Pulse: 65 69 80 63  Resp: 15 14 17 14   Temp:      TempSrc:      SpO2: 98% 98% 100% 97%    Physical Exam Constitutional:      Appearance: He is not  diaphoretic.  HENT:     Head: Normocephalic and atraumatic.  Eyes:     Pupils: Pupils are equal, round, and reactive to light.  Cardiovascular:     Rate and Rhythm: Normal rate and regular rhythm.     Pulses: Normal pulses.  Pulmonary:     Effort: Pulmonary effort is normal. No respiratory distress.     Breath sounds: No wheezing or rales.  Abdominal:     General: Bowel sounds are normal.     Tenderness: There is no abdominal tenderness. There is no guarding.  Musculoskeletal:        General: No swelling or tenderness.     Cervical back: Normal range of motion and neck supple.  Skin:  General: Skin is warm and dry.  Neurological:     Comments: Very somnolent, not following commands Lower extremities rigid Muscle fasciculations noted in upper extremities     Labs on Admission: I have personally reviewed following labs and imaging studies  CBC: Recent Labs  Lab 12/01/19 1839 12/01/19 1840 12/01/19 2020  WBC 8.3  --   --   NEUTROABS 4.8  --   --   HGB 14.7 15.3 14.3  HCT 46.2 45.0 42.0  MCV 98.3  --   --   PLT 187  --   --    Basic Metabolic Panel: Recent Labs  Lab 12/01/19 1839 12/01/19 1840 12/01/19 2020  NA 136 137 137  K 3.9 3.8 3.8  CL 101 99  --   CO2 23  --   --   GLUCOSE 78 76  --   BUN 8 9  --   CREATININE 0.77 0.80  --   CALCIUM 8.5*  --   --    GFR: CrCl cannot be calculated (Unknown ideal weight.). Liver Function Tests: Recent Labs  Lab 12/01/19 1839  AST 15  ALT 15  ALKPHOS 54  BILITOT 0.8  PROT 6.1*  ALBUMIN 3.5   No results for input(s): LIPASE, AMYLASE in the last 168 hours. No results for input(s): AMMONIA in the last 168 hours. Coagulation Profile: Recent Labs  Lab 12/01/19 1839  INR 1.1   Cardiac Enzymes: No results for input(s): CKTOTAL, CKMB, CKMBINDEX, TROPONINI in the last 168 hours. BNP (last 3 results) No results for input(s): PROBNP in the last 8760 hours. HbA1C: No results for input(s): HGBA1C in the last 72  hours. CBG: Recent Labs  Lab 12/01/19 1833  GLUCAP 76   Lipid Profile: No results for input(s): CHOL, HDL, LDLCALC, TRIG, CHOLHDL, LDLDIRECT in the last 72 hours. Thyroid Function Tests: No results for input(s): TSH, T4TOTAL, FREET4, T3FREE, THYROIDAB in the last 72 hours. Anemia Panel: No results for input(s): VITAMINB12, FOLATE, FERRITIN, TIBC, IRON, RETICCTPCT in the last 72 hours. Urine analysis:    Component Value Date/Time   COLORURINE YELLOW 12/01/2019 1918   APPEARANCEUR CLEAR 12/01/2019 1918   LABSPEC 1.034 (H) 12/01/2019 1918   PHURINE 5.0 12/01/2019 1918   GLUCOSEU NEGATIVE 12/01/2019 1918   HGBUR SMALL (A) 12/01/2019 1918   BILIRUBINUR NEGATIVE 12/01/2019 1918   KETONESUR NEGATIVE 12/01/2019 1918   PROTEINUR NEGATIVE 12/01/2019 1918   NITRITE NEGATIVE 12/01/2019 1918   LEUKOCYTESUR NEGATIVE 12/01/2019 1918    Radiological Exams on Admission: CT Cervical Spine Wo Contrast  Result Date: 12/01/2019 CLINICAL DATA:  Neck trauma EXAM: CT CERVICAL SPINE WITHOUT CONTRAST TECHNIQUE: Multidetector CT imaging of the cervical spine was performed without intravenous contrast. Multiplanar CT image reconstructions were also generated. COMPARISON:  None. FINDINGS: Alignment: No subluxation.  Facet alignment within normal limits. Skull base and vertebrae: No acute fracture. No primary bone lesion or focal pathologic process. Soft tissues and spinal canal: No prevertebral fluid or swelling. No visible canal hematoma. Disc levels:  Mild diffuse degenerative changes at multiple levels. Upper chest: Negative. Other: None IMPRESSION: Mild degenerative changes of the cervical spine. No acute osseous abnormality. Electronically Signed   By: Donavan Foil M.D.   On: 12/01/2019 19:17   MR BRAIN WO CONTRAST  Result Date: 12/01/2019 CLINICAL DATA:  54 year old male code stroke presentation. EXAM: MRI HEAD WITHOUT CONTRAST TECHNIQUE: Multiplanar, multiecho pulse sequences of the brain and  surrounding structures were obtained without intravenous contrast. COMPARISON:  Plain head CT  and CTA head and neck. Brain MRI 11/06/2004. FINDINGS: Brain: No restricted diffusion to suggest acute infarction. No midline shift, mass effect, evidence of mass lesion, ventriculomegaly, extra-axial collection or acute intracranial hemorrhage. Cervicomedullary junction and pituitary are within normal limits. Pearline Cables and white matter signal remains within normal limits throughout the brain. No convincing encephalomalacia. No chronic cerebral blood products. Vascular: Major intracranial vascular flow voids are stable since 2006. Skull and upper cervical spine: Negative. Sinuses/Orbits: Negative. Other: Mastoids are clear. Grossly negative visible internal auditory structures, scalp and face soft tissues. IMPRESSION: Normal noncontrast MRI appearance of the brain. Electronically Signed   By: Genevie Ann M.D.   On: 12/01/2019 21:52   DG Chest Portable 1 View  Result Date: 12/01/2019 CLINICAL DATA:  Shortness of breath EXAM: PORTABLE CHEST 1 VIEW COMPARISON:  CT 09/14/2018, radiograph 09/14/2018 FINDINGS: Chronically coarsened interstitial changes towards the lung bases with apical lucency compatible with areas of emphysematous change and basilar scarring particularly within the lingula. These are not significantly changed from comparison radiography. No new consolidative opacity. The cardiomediastinal contours are unremarkable. Telemetry leads overlie the chest. No acute osseous or soft tissue abnormality. Degenerative changes are present in the imaged spine and shoulders. IMPRESSION: Chronically coarsened interstitial changes and apical lucency compatible with areas of basilar scarring and emphysematous features better seen on comparison CT. No acute cardiopulmonary abnormality. Electronically Signed   By: Lovena Le M.D.   On: 12/01/2019 19:34   CT HEAD CODE STROKE WO CONTRAST  Result Date: 12/01/2019 CLINICAL DATA:   Code stroke.  54 year old male EXAM: CT HEAD WITHOUT CONTRAST TECHNIQUE: Contiguous axial images were obtained from the base of the skull through the vertex without intravenous contrast. COMPARISON:  Brain MRI 11/07/2003. FINDINGS: Brain: Cerebral volume is not significantly changed since 2006. No midline shift, ventriculomegaly, mass effect, evidence of mass lesion, intracranial hemorrhage or evidence of cortically based acute infarction. Gray-white matter differentiation is within normal limits throughout the brain. No cortical encephalomalacia identified. Vascular: Calcified atherosclerosis at the skull base. No suspicious intracranial vascular hyperdensity. Skull: Negative. Sinuses/Orbits: Visualized paranasal sinuses and mastoids are clear. Other: Visualized orbits and scalp soft tissues are within normal limits. ASPECTS [facility] Stroke Program Early CT Score) Total score (0-10 with 10 being normal): 10 IMPRESSION: 1. Normal noncontrast CT appearance of the brain. ASPECTS 10. 2. These results were communicated to Dr. Theda Sers at 7:03 pm on 12/01/2019 by text page via the [facility] messaging system. Electronically Signed   By: Genevie Ann M.D.   On: 12/01/2019 19:03   CT ANGIO HEAD CODE STROKE  Result Date: 12/01/2019 CLINICAL DATA:  54 year old male code stroke presentation. EXAM: CT ANGIOGRAPHY HEAD AND NECK TECHNIQUE: Multidetector CT imaging of the head and neck was performed using the standard protocol during bolus administration of intravenous contrast. Multiplanar CT image reconstructions and MIPs were obtained to evaluate the vascular anatomy. Carotid stenosis measurements (when applicable) are obtained utilizing NASCET criteria, using the distal internal carotid diameter as the denominator. CONTRAST:  75mL OMNIPAQUE IOHEXOL 350 MG/ML SOLN COMPARISON:  Plain head CT 1855 hours today. FINDINGS: CTA NECK Skeleton: No acute osseous abnormality identified. Upper chest: Paraseptal emphysema in the medial left upper  lobe. Atelectatic changes to the trachea. No superior mediastinal lymphadenopathy. Other neck: Reflux of venous contrast from the injected left subclavian into the paravertebral venous system greater on the left. No acute finding in the neck. Aortic arch: 4 vessel arch configuration. The left vertebral arises directly from the arch. Soft plaque in  the arch and at most of the great vessel origins. Right carotid system: Soft plaque at the brachiocephalic artery origin without significant stenosis. Normal right CCA. Negative right carotid bifurcation and cervical right ICA. Left carotid system: Soft plaque at the left CCA origin without stenosis. Mild plaque at the left ICA origin and bulb without stenosis. Vertebral arteries: Mild plaque in the proximal right subclavian artery without stenosis. Normal right vertebral artery origin. Right vertebral is patent and normal to the skull base. Left vertebral arises directly from the arch with minimal plaque at its origin. Patent left vertebral to the skull base with no plaque or stenosis identified. CTA HEAD Posterior circulation: Patent distal vertebral arteries and vertebrobasilar junction. The right V4 segment is mildly dominant. There is mild irregularity and stenosis in the distal left V4 segment. Normal right PICA origin. The left AICA may be dominant. Patent SCA and PCA origins. Posterior communicating arteries are diminutive or absent. Bilateral PCA branches are within normal limits. Anterior circulation: Both ICA siphons are patent. On the left there is mild plaque without stenosis. Minimal plaque on the right without stenosis. Patent carotid termini. Patent MCA and ACA origins. Mildly dominant left A1. Anterior communicating artery and bilateral ACA branches are within normal limits. Left MCA M1 segment and trifurcation are patent without stenosis. Left MCA branches are within normal limits. Right MCA M1 segment and bifurcation are patent without stenosis. Right  MCA branches are within normal limits. Venous sinuses: Patent. Anatomic variants: Left vertebral artery arises directly from the arch and is mildly non dominant. Mildly dominant left ACA A1. Review of the MIP images confirms the above findings IMPRESSION: 1. Negative for large vessel occlusion. 2. Generally mild atherosclerosis in the head and neck. No significant arterial stenosis identified. 3. Emphysema (ICD10-J43.9). Vascular results were communicated to Dr. Lorrin Goodell at 7:20 pmon 10/1/2021by text page via the [facility] messaging system. Electronically Signed   By: Genevie Ann M.D.   On: 12/01/2019 19:20   CT ANGIO NECK CODE STROKE  Result Date: 12/01/2019 CLINICAL DATA:  54 year old male code stroke presentation. EXAM: CT ANGIOGRAPHY HEAD AND NECK TECHNIQUE: Multidetector CT imaging of the head and neck was performed using the standard protocol during bolus administration of intravenous contrast. Multiplanar CT image reconstructions and MIPs were obtained to evaluate the vascular anatomy. Carotid stenosis measurements (when applicable) are obtained utilizing NASCET criteria, using the distal internal carotid diameter as the denominator. CONTRAST:  70mL OMNIPAQUE IOHEXOL 350 MG/ML SOLN COMPARISON:  Plain head CT 1855 hours today. FINDINGS: CTA NECK Skeleton: No acute osseous abnormality identified. Upper chest: Paraseptal emphysema in the medial left upper lobe. Atelectatic changes to the trachea. No superior mediastinal lymphadenopathy. Other neck: Reflux of venous contrast from the injected left subclavian into the paravertebral venous system greater on the left. No acute finding in the neck. Aortic arch: 4 vessel arch configuration. The left vertebral arises directly from the arch. Soft plaque in the arch and at most of the great vessel origins. Right carotid system: Soft plaque at the brachiocephalic artery origin without significant stenosis. Normal right CCA. Negative right carotid bifurcation and cervical  right ICA. Left carotid system: Soft plaque at the left CCA origin without stenosis. Mild plaque at the left ICA origin and bulb without stenosis. Vertebral arteries: Mild plaque in the proximal right subclavian artery without stenosis. Normal right vertebral artery origin. Right vertebral is patent and normal to the skull base. Left vertebral arises directly from the arch with minimal plaque at its  origin. Patent left vertebral to the skull base with no plaque or stenosis identified. CTA HEAD Posterior circulation: Patent distal vertebral arteries and vertebrobasilar junction. The right V4 segment is mildly dominant. There is mild irregularity and stenosis in the distal left V4 segment. Normal right PICA origin. The left AICA may be dominant. Patent SCA and PCA origins. Posterior communicating arteries are diminutive or absent. Bilateral PCA branches are within normal limits. Anterior circulation: Both ICA siphons are patent. On the left there is mild plaque without stenosis. Minimal plaque on the right without stenosis. Patent carotid termini. Patent MCA and ACA origins. Mildly dominant left A1. Anterior communicating artery and bilateral ACA branches are within normal limits. Left MCA M1 segment and trifurcation are patent without stenosis. Left MCA branches are within normal limits. Right MCA M1 segment and bifurcation are patent without stenosis. Right MCA branches are within normal limits. Venous sinuses: Patent. Anatomic variants: Left vertebral artery arises directly from the arch and is mildly non dominant. Mildly dominant left ACA A1. Review of the MIP images confirms the above findings IMPRESSION: 1. Negative for large vessel occlusion. 2. Generally mild atherosclerosis in the head and neck. No significant arterial stenosis identified. 3. Emphysema (ICD10-J43.9). Vascular results were communicated to Dr. Lorrin Goodell at 7:20 pmon 10/1/2021by text page via the [facility] messaging system. Electronically Signed    By: Genevie Ann M.D.   On: 12/01/2019 19:20    EKG: Independently reviewed.  Sinus rhythm, no acute changes.  Assessment/Plan Principal Problem:   AMS (altered mental status) Active Problems:   COPD (chronic obstructive pulmonary disease) (HCC)   Fall   History of pulmonary embolism   Depression   Altered mental status: Unclear exact etiology.  Work-up negative for acute stroke including head CT, CTA head and neck, and brain MRI.  No significant electrolyte derangements on labs.  UA not suggestive of infection.  Chest x-ray not suggestive of pneumonia.  Patient was initially obtunded and unresponsive even to painful stimuli at the time of my evaluation.  UDS positive for benzodiazepines (takes Xanax at home).  He was given flumazenil 0.2 mg due to concern for possible benzodiazepine overdose and soon after became more awake and was able to open his eyes.  However, still not alert and not following commands.  Lower extremities rigid and muscle fasciculations noted in upper extremities bilaterally.  He is currently on BiPAP and able to protect his airway.  Vital signs stable. He does take citalopram at home but serotonin syndrome less likely given stable vital signs, no fever, diaphoresis, or agitation.  Wife confirms that patient does not drink alcohol. -Polypharmacy likely contributing -hold Xanax, citalopram, and gabapentin at this time.  Avoid sedating medications. -Discussed the case with neurology again and recommendation was to obtain routine EEG which has been ordered.  Will also check ammonia level.  Salicylate and ammonia levels pending.  He had hypoxia and mild hypercarbia on his initial ABG.  Continue BiPAP at this time and repeat ABG.  Admit to progressive care unit and monitor very closely.   Addendum: Also check CK level.  Unwitnessed falls: Unclear if these were syncopal events.  EKG not suggestive of arrhythmia.  PE less likely given no tachycardia and he is already on chronic  anticoagulation with Eliquis. -Continue cardiac monitoring.  Order echocardiogram.  Unable to check orthostatics at this time.  Will need PT/OT evaluation after his mental status improves.  Anxiety, depression -Hold home medications at this time  COPD: Stable.  No  signs of acute exacerbation at this time. -Continue home inhalers  History of PE/DVT -Continue Eliquis  DVT prophylaxis: On chronic anticoagulation with Eliquis Code Status: Full code Family Communication: Wife at bedside. Disposition Plan: Status is: Observation  The patient remains OBS appropriate and will d/c before 2 midnights.  Dispo: The patient is from: Home              Anticipated d/c is to: Home              Anticipated d/c date is: 2 days              Patient currently is not medically stable to d/c.  The medical decision making on this patient was of high complexity and the patient is at high risk for clinical deterioration, therefore this is a level 3 visit.  Shela Leff MD [facility]  If 7PM-7AM, please contact night-coverage [email or web address]  12/02/2019, 1:53 AM

## 2019-12-01 NOTE — ED Provider Notes (Signed)
[place] EMERGENCY DEPARTMENT Provider Note   CSN: [id number] Arrival date & time: 12/01/19  1831  An emergency department physician performed an initial assessment on this suspected stroke patient at [facility].  History No chief complaint on file.   Jason Barajas is a 54 y.o. male.  Pt presents to the ED today with AMS.  Pt fell at 1000.  Unwitnessed.  He was fine until this evening around 1730.  He fell again around 1800 and was unresponsive.  Pt unable to give any hx.  Pt's wife said she keeps his meds and does not think he overdosed on anything.        Past Medical History:  Diagnosis Date  . Allergy   . Anxiety   . Clotting disorder ([place])   . Colon polyps   . COPD, severe ([place])   . Depression   . DVT (deep venous thrombosis) ([place])   . GERD (gastroesophageal reflux disease)   . H/O blood clots 04/2016  . History of anal fissures   . Hyperlipidemia   . Hypertension   . IBS (irritable bowel syndrome)   . Pulmonary embolism [facility])     Patient Active Problem List   Diagnosis Date Noted  . AMS (altered mental status) 12/01/2019  . SMOKER 05/01/2008  . PULMONARY NODULE 05/01/2008  . COPD UNSPECIFIED 03/16/2008    Past Surgical History:  Procedure Laterality Date  . CHOLECYSTECTOMY    . COLONOSCOPY  04/01/2016   Significant colonic polyps status post polypectomy. Minimal sig,oid diverticulosis. Tubular adenomas.   Marland Kitchen POLYPECTOMY    . TONSILLECTOMY AND ADENOIDECTOMY    . UPPER GASTROINTESTINAL ENDOSCOPY    . WISDOM TOOTH EXTRACTION         Family History  Problem Relation Age of Onset  . Colon cancer Father        part of colon removed- not sure why  . Esophageal cancer Neg Hx   . Rectal cancer Neg Hx   . Stomach cancer Neg Hx     Social History   Tobacco Use  . Smoking status: Current Every Day Smoker    Packs/day: 1.00    Types: Cigarettes  . Smokeless tobacco: Never Used  Vaping Use  . Vaping Use: Never used  Substance Use  Topics  . Alcohol use: Not Currently  . Drug use: Never    Home Medications Prior to Admission medications   Medication Sig Start Date End Date Taking? Authorizing Provider  acetaminophen (TYLENOL) 500 MG tablet Take 500 mg by mouth every 6 (six) hours as needed for headache (pain).   Yes [provider]  albuterol (PROAIR HFA) 108 (90 Base) MCG/ACT inhaler Inhale 2 puffs into the lungs every 6 (six) hours as needed for wheezing or shortness of breath.   Yes [provider]  albuterol (PROVENTIL) (2.5 MG/3ML) 0.083% nebulizer solution Take 2.5 mg by nebulization every 4 (four) hours as needed for wheezing or shortness of breath.  07/31/17  Yes [provider]  ALPRAZolam Duanne Moron) 1 MG tablet Take 1 mg by mouth 5 (five) times daily. 11/13/19  Yes [provider]  apixaban (ELIQUIS) 2.5 MG TABS tablet Take 2.5 mg by mouth 2 (two) times daily.   Yes [provider]  budesonide-formoterol (SYMBICORT) 160-4.5 MCG/ACT inhaler Inhale 2 puffs into the lungs 2 (two) times daily.   Yes [provider]  citalopram (CELEXA) 40 MG tablet Take 20-40 mg by mouth See admin instructions. Take 1/2 tablet (20 mg)  by mouth every morning and 1 tablet (40 mg) at night 09/18/17  Yes [provider]  gabapentin (NEURONTIN) 600 MG tablet Take 600 mg by mouth 2 (two) times daily as needed (panic attacks).  10/13/19  Yes [provider]  Naphazoline HCl (CLEAR EYES OP) Place 1 drop into both eyes daily as needed (dry eyes/irritation).   Yes [provider]  olmesartan-hydrochlorothiazide (BENICAR HCT) 20-12.5 MG tablet Take 1 tablet by mouth daily. 09/17/17  Yes [provider]  pantoprazole (PROTONIX) 40 MG tablet Take 40 mg by mouth daily. 11/15/19  Yes [provider]  simvastatin (ZOCOR) 20 MG tablet Take 20 mg by mouth daily. 08/19/17  Yes [provider]    Allergies    Penicillins and Codeine  Review of Systems    Review of Systems  Unable to perform ROS: Mental status change    Physical Exam Updated Vital Signs BP 114/79   Pulse 60   Temp (!) 97.4 F (36.3 C) (Tympanic)   Resp 15   SpO2 100%   Physical Exam Vitals and nursing note reviewed.  Constitutional:      General: He is in acute distress.  HENT:     Head: Normocephalic and atraumatic.     Right Ear: External ear normal.     Left Ear: External ear normal.     Nose: Nose normal.     Mouth/Throat:     Mouth: Mucous membranes are moist.     Pharynx: Oropharynx is clear.  Eyes:     Extraocular Movements: Extraocular movements intact.     Conjunctiva/sclera: Conjunctivae normal.     Pupils: Pupils are equal, round, and reactive to light.  Cardiovascular:     Rate and Rhythm: Normal rate and regular rhythm.     Pulses: Normal pulses.     Heart sounds: Normal heart sounds.  Pulmonary:     Effort: Pulmonary effort is normal.     Breath sounds: Normal breath sounds.  Abdominal:     General: Abdomen is flat. Bowel sounds are normal.     Palpations: Abdomen is soft.  Musculoskeletal:        General: Normal range of motion.     Cervical back: Normal range of motion and neck supple.  Skin:    General: Skin is warm.     Capillary Refill: Capillary refill takes less than 2 seconds.  Neurological:     Comments: Responsive to painful stimuli only.    Psychiatric:        Mood and Affect: Mood normal.        Behavior: Behavior normal.     ED Results / Procedures / Treatments   Labs (all labs ordered are listed, but only abnormal results are displayed) Labs Reviewed  COMPREHENSIVE METABOLIC PANEL - Abnormal; Notable for the following components:      Result Value   Calcium 8.5 (*)    Total Protein 6.1 (*)    All other components within normal limits  URINALYSIS, ROUTINE W REFLEX MICROSCOPIC - Abnormal; Notable for the following components:   Specific Gravity, Urine 1.034 (*)    Hgb urine dipstick SMALL (*)    All other  components within normal limits  RAPID URINE DRUG SCREEN, HOSP PERFORMED - Abnormal; Notable for the following components:   Benzodiazepines POSITIVE (*)    All other components within normal limits  I-STAT CHEM 8, ED - Abnormal; Notable for the following components:   Calcium, Ion 1.03 (*)  All other components within normal limits  I-STAT ARTERIAL BLOOD GAS, ED - Abnormal; Notable for the following components:   pH, Arterial 7.335 (*)    pCO2 arterial 54.7 (*)    pO2, Arterial 60 (*)    Bicarbonate 29.2 (*)    All other components within normal limits  RESPIRATORY PANEL BY RT PCR (FLU A&B, COVID)  PROTIME-INR  APTT  CBC  DIFFERENTIAL  ETHANOL  ACETAMINOPHEN LEVEL  SALICYLATE LEVEL  CBG MONITORING, ED  CBG MONITORING, ED    EKG EKG Interpretation  Date/Time:  Friday December 01 2019 19:22:44 EDT Ventricular Rate:  70 PR Interval:    QRS Duration: 107 QT Interval:  432 QTC Calculation: 467 R Axis:   83 Text Interpretation: Sinus rhythm Anteroseptal infarct, age indeterminate No significant change since last tracing Confirmed by Isla Pence [phone]) on 12/01/2019 8:08:04 PM   Radiology CT Cervical Spine Wo Contrast  Result Date: 12/01/2019 CLINICAL DATA:  Neck trauma EXAM: CT CERVICAL SPINE WITHOUT CONTRAST TECHNIQUE: Multidetector CT imaging of the cervical spine was performed without intravenous contrast. Multiplanar CT image reconstructions were also generated. COMPARISON:  None. FINDINGS: Alignment: No subluxation.  Facet alignment within normal limits. Skull base and vertebrae: No acute fracture. No primary bone lesion or focal pathologic process. Soft tissues and spinal canal: No prevertebral fluid or swelling. No visible canal hematoma. Disc levels:  Mild diffuse degenerative changes at multiple levels. Upper chest: Negative. Other: None IMPRESSION: Mild degenerative changes of the cervical spine. No acute osseous abnormality. Electronically Signed   By: Donavan Foil M.D.   On: 12/01/2019 19:17   MR BRAIN WO CONTRAST  Result Date: 12/01/2019 CLINICAL DATA:  54 year old male code stroke presentation. EXAM: MRI HEAD WITHOUT CONTRAST TECHNIQUE: Multiplanar, multiecho pulse sequences of the brain and surrounding structures were obtained without intravenous contrast. COMPARISON:  Plain head CT and CTA head and neck. Brain MRI 11/06/2004. FINDINGS: Brain: No restricted diffusion to suggest acute infarction. No midline shift, mass effect, evidence of mass lesion, ventriculomegaly, extra-axial collection or acute intracranial hemorrhage. Cervicomedullary junction and pituitary are within normal limits. Pearline Cables and white matter signal remains within normal limits throughout the brain. No convincing encephalomalacia. No chronic cerebral blood products. Vascular: Major intracranial vascular flow voids are stable since 2006. Skull and upper cervical spine: Negative. Sinuses/Orbits: Negative. Other: Mastoids are clear. Grossly negative visible internal auditory structures, scalp and face soft tissues. IMPRESSION: Normal noncontrast MRI appearance of the brain. Electronically Signed   By: Genevie Ann M.D.   On: 12/01/2019 21:52   DG Chest Portable 1 View  Result Date: 12/01/2019 CLINICAL DATA:  Shortness of breath EXAM: PORTABLE CHEST 1 VIEW COMPARISON:  CT 09/14/2018, radiograph 09/14/2018 FINDINGS: Chronically coarsened interstitial changes towards the lung bases with apical lucency compatible with areas of emphysematous change and basilar scarring particularly within the lingula. These are not significantly changed from comparison radiography. No new consolidative opacity. The cardiomediastinal contours are unremarkable. Telemetry leads overlie the chest. No acute osseous or soft tissue abnormality. Degenerative changes are present in the imaged spine and shoulders. IMPRESSION: Chronically coarsened interstitial changes and apical lucency compatible with areas of basilar scarring  and emphysematous features better seen on comparison CT. No acute cardiopulmonary abnormality. Electronically Signed   By: Lovena Le M.D.   On: 12/01/2019 19:34   CT HEAD CODE STROKE WO CONTRAST  Result Date: 12/01/2019 CLINICAL DATA:  Code stroke.  54 year old male EXAM: CT HEAD WITHOUT CONTRAST TECHNIQUE: Contiguous axial images were obtained  from the base of the skull through the vertex without intravenous contrast. COMPARISON:  Brain MRI 11/07/2003. FINDINGS: Brain: Cerebral volume is not significantly changed since 2006. No midline shift, ventriculomegaly, mass effect, evidence of mass lesion, intracranial hemorrhage or evidence of cortically based acute infarction. Gray-white matter differentiation is within normal limits throughout the brain. No cortical encephalomalacia identified. Vascular: Calcified atherosclerosis at the skull base. No suspicious intracranial vascular hyperdensity. Skull: Negative. Sinuses/Orbits: Visualized paranasal sinuses and mastoids are clear. Other: Visualized orbits and scalp soft tissues are within normal limits. ASPECTS [facility] Stroke Program Early CT Score) Total score (0-10 with 10 being normal): 10 IMPRESSION: 1. Normal noncontrast CT appearance of the brain. ASPECTS 10. 2. These results were communicated to Dr. Theda Sers at 7:03 pm on 12/01/2019 by text page via the [facility] messaging system. Electronically Signed   By: Genevie Ann M.D.   On: 12/01/2019 19:03   CT ANGIO HEAD CODE STROKE  Result Date: 12/01/2019 CLINICAL DATA:  54 year old male code stroke presentation. EXAM: CT ANGIOGRAPHY HEAD AND NECK TECHNIQUE: Multidetector CT imaging of the head and neck was performed using the standard protocol during bolus administration of intravenous contrast. Multiplanar CT image reconstructions and MIPs were obtained to evaluate the vascular anatomy. Carotid stenosis measurements (when applicable) are obtained utilizing NASCET criteria, using the distal internal carotid diameter  as the denominator. CONTRAST:  63mL OMNIPAQUE IOHEXOL 350 MG/ML SOLN COMPARISON:  Plain head CT 1855 hours today. FINDINGS: CTA NECK Skeleton: No acute osseous abnormality identified. Upper chest: Paraseptal emphysema in the medial left upper lobe. Atelectatic changes to the trachea. No superior mediastinal lymphadenopathy. Other neck: Reflux of venous contrast from the injected left subclavian into the paravertebral venous system greater on the left. No acute finding in the neck. Aortic arch: 4 vessel arch configuration. The left vertebral arises directly from the arch. Soft plaque in the arch and at most of the great vessel origins. Right carotid system: Soft plaque at the brachiocephalic artery origin without significant stenosis. Normal right CCA. Negative right carotid bifurcation and cervical right ICA. Left carotid system: Soft plaque at the left CCA origin without stenosis. Mild plaque at the left ICA origin and bulb without stenosis. Vertebral arteries: Mild plaque in the proximal right subclavian artery without stenosis. Normal right vertebral artery origin. Right vertebral is patent and normal to the skull base. Left vertebral arises directly from the arch with minimal plaque at its origin. Patent left vertebral to the skull base with no plaque or stenosis identified. CTA HEAD Posterior circulation: Patent distal vertebral arteries and vertebrobasilar junction. The right V4 segment is mildly dominant. There is mild irregularity and stenosis in the distal left V4 segment. Normal right PICA origin. The left AICA may be dominant. Patent SCA and PCA origins. Posterior communicating arteries are diminutive or absent. Bilateral PCA branches are within normal limits. Anterior circulation: Both ICA siphons are patent. On the left there is mild plaque without stenosis. Minimal plaque on the right without stenosis. Patent carotid termini. Patent MCA and ACA origins. Mildly dominant left A1. Anterior communicating  artery and bilateral ACA branches are within normal limits. Left MCA M1 segment and trifurcation are patent without stenosis. Left MCA branches are within normal limits. Right MCA M1 segment and bifurcation are patent without stenosis. Right MCA branches are within normal limits. Venous sinuses: Patent. Anatomic variants: Left vertebral artery arises directly from the arch and is mildly non dominant. Mildly dominant left ACA A1. Review of the MIP images confirms the  above findings IMPRESSION: 1. Negative for large vessel occlusion. 2. Generally mild atherosclerosis in the head and neck. No significant arterial stenosis identified. 3. Emphysema (ICD10-J43.9). Vascular results were communicated to Dr. Lorrin Goodell at 7:20 pmon 10/1/2021by text page via the [facility] messaging system. Electronically Signed   By: Genevie Ann M.D.   On: 12/01/2019 19:20   CT ANGIO NECK CODE STROKE  Result Date: 12/01/2019 CLINICAL DATA:  54 year old male code stroke presentation. EXAM: CT ANGIOGRAPHY HEAD AND NECK TECHNIQUE: Multidetector CT imaging of the head and neck was performed using the standard protocol during bolus administration of intravenous contrast. Multiplanar CT image reconstructions and MIPs were obtained to evaluate the vascular anatomy. Carotid stenosis measurements (when applicable) are obtained utilizing NASCET criteria, using the distal internal carotid diameter as the denominator. CONTRAST:  35mL OMNIPAQUE IOHEXOL 350 MG/ML SOLN COMPARISON:  Plain head CT 1855 hours today. FINDINGS: CTA NECK Skeleton: No acute osseous abnormality identified. Upper chest: Paraseptal emphysema in the medial left upper lobe. Atelectatic changes to the trachea. No superior mediastinal lymphadenopathy. Other neck: Reflux of venous contrast from the injected left subclavian into the paravertebral venous system greater on the left. No acute finding in the neck. Aortic arch: 4 vessel arch configuration. The left vertebral arises directly from  the arch. Soft plaque in the arch and at most of the great vessel origins. Right carotid system: Soft plaque at the brachiocephalic artery origin without significant stenosis. Normal right CCA. Negative right carotid bifurcation and cervical right ICA. Left carotid system: Soft plaque at the left CCA origin without stenosis. Mild plaque at the left ICA origin and bulb without stenosis. Vertebral arteries: Mild plaque in the proximal right subclavian artery without stenosis. Normal right vertebral artery origin. Right vertebral is patent and normal to the skull base. Left vertebral arises directly from the arch with minimal plaque at its origin. Patent left vertebral to the skull base with no plaque or stenosis identified. CTA HEAD Posterior circulation: Patent distal vertebral arteries and vertebrobasilar junction. The right V4 segment is mildly dominant. There is mild irregularity and stenosis in the distal left V4 segment. Normal right PICA origin. The left AICA may be dominant. Patent SCA and PCA origins. Posterior communicating arteries are diminutive or absent. Bilateral PCA branches are within normal limits. Anterior circulation: Both ICA siphons are patent. On the left there is mild plaque without stenosis. Minimal plaque on the right without stenosis. Patent carotid termini. Patent MCA and ACA origins. Mildly dominant left A1. Anterior communicating artery and bilateral ACA branches are within normal limits. Left MCA M1 segment and trifurcation are patent without stenosis. Left MCA branches are within normal limits. Right MCA M1 segment and bifurcation are patent without stenosis. Right MCA branches are within normal limits. Venous sinuses: Patent. Anatomic variants: Left vertebral artery arises directly from the arch and is mildly non dominant. Mildly dominant left ACA A1. Review of the MIP images confirms the above findings IMPRESSION: 1. Negative for large vessel occlusion. 2. Generally mild  atherosclerosis in the head and neck. No significant arterial stenosis identified. 3. Emphysema (ICD10-J43.9). Vascular results were communicated to Dr. Lorrin Goodell at 7:20 pmon 10/1/2021by text page via the [facility] messaging system. Electronically Signed   By: Genevie Ann M.D.   On: 12/01/2019 19:20    Procedures Procedures (including critical care time)  Medications Ordered in ED Medications  sodium chloride flush (NS) 0.9 % injection 3 mL (3 mLs Intravenous Given 12/01/19 2029)  iohexol (OMNIPAQUE) 350 MG/ML injection 75  mL (75 mLs Intravenous Contrast Given 12/01/19 1906)  sodium chloride 0.9 % bolus 1,000 mL (1,000 mLs Intravenous New Bag/Given 12/01/19 2046)    ED Course  I have reviewed the triage vital signs and the nursing notes.  Pertinent labs & imaging results that were available during my care of the patient were reviewed by me and considered in my medical decision making (see chart for details).    MDM Rules/Calculators/A&P                          Code Stroke called by EMS.  Neurologist saw pt in the scanner.  CT head nl.  CTA head and neck neg for LVO.  He is on Eliquis so not a candidate for tpa.  Dr. Theda Sers recommends MRI/MRA.  MRI negative.  UDS + for benzos.  Pt is on Xanax.  Pt put on bipap briefly, but it did not seem to do anything, so it was d/c'd.  Pt on 2 L oxygen.  Pt is still somnolent, but is waking up slowly and spontaneously opening his eyes.    Pt is vaccinated.  Covid is negative.  Pt d/w Dr. Marlowe Sax (triad) for admission.  CRITICAL CARE Performed by: Isla Pence   Total critical care time: 30 minutes  Critical care time was exclusive of separately billable procedures and treating other patients.  Critical care was necessary to treat or prevent imminent or life-threatening deterioration.  Critical care was time spent personally by me on the following activities: development of treatment plan with patient and/or surrogate as well as nursing,  discussions with consultants, evaluation of patient's response to treatment, examination of patient, obtaining history from patient or surrogate, ordering and performing treatments and interventions, ordering and review of laboratory studies, ordering and review of radiographic studies, pulse oximetry and re-evaluation of patient's condition. Final Clinical Impression(s) / ED Diagnoses Final diagnoses:  Altered mental status, unspecified altered mental status type    Rx / DC Orders ED Discharge Orders    None       Isla Pence, MD 12/01/19 2307

## 2019-12-01 NOTE — H&P (Addendum)
Neurology H&P  CC: found down unresponsive  History is obtained from: EMS  HPI: Jason Barajas is a 54 y.o. male last normal approximately 1745 today and family found him down at 1800. Patient was unresponsive on arrival.  tpa given?: No, patient on eliquis IR Thrombectomy? No Modified Rankin Scale: 1-No significant post stroke disability and can perform usual duties with stroke symptoms NIHSS: 35   ROS: Unable to obtain due to altered mental status.   Past Medical History:  Diagnosis Date  . Allergy   . Anxiety   . Clotting disorder ([place])   . Colon polyps   . COPD, severe ([place])   . Depression   . DVT (deep venous thrombosis) ([place])   . GERD (gastroesophageal reflux disease)   . H/O blood clots 04/2016  . History of anal fissures   . Hyperlipidemia   . Hypertension   . IBS (irritable bowel syndrome)   . Pulmonary embolism ([facility])      Family History  Problem Relation Age of Onset  . Colon cancer Father        part of colon removed- not sure why  . Esophageal cancer Neg Hx   . Rectal cancer Neg Hx   . Stomach cancer Neg Hx      Social History:  reports that he has been smoking cigarettes. He has been smoking about 1.00 pack per day. He has never used smokeless tobacco. He reports previous alcohol use. He reports that he does not use drugs.  Prior to Admission medications   Medication Sig Start Date End Date Taking? Authorizing Provider  albuterol (PROVENTIL HFA;VENTOLIN HFA) 108 (90 Base) MCG/ACT inhaler Inhale 2 puffs into the lungs as needed.    [provider]  albuterol (PROVENTIL) (2.5 MG/3ML) 0.083% nebulizer solution  07/31/17   [provider]  alprazolam Duanne Moron) 2 MG tablet Take 2 mg by mouth 2 (two) times daily as needed for anxiety. Took 1/2 tablet (1mg ) today 09/18/17   [provider]  budesonide-formoterol (SYMBICORT) 160-4.5 MCG/ACT inhaler Inhale 2 puffs into the lungs 2 (two) times daily.    [provider]  citalopram  (CELEXA) 40 MG tablet Take 40 mg by mouth daily. 09/18/17   [provider]  ELIQUIS 2.5 MG TABS tablet Take 2.5 mg by mouth 2 (two) times daily.  07/19/17   [provider]  gabapentin (NEURONTIN) 300 MG capsule Take 600 mg by mouth as needed.    [provider]  olmesartan-hydrochlorothiazide (BENICAR HCT) 20-12.5 MG tablet Take 1 tablet by mouth daily. 09/17/17   [provider]  simvastatin (ZOCOR) 20 MG tablet Take 20 mg by mouth daily. 08/19/17   [provider]     Exam: Current vital signs: BP 109/64   Pulse 77   Temp (!) 97.4 F (36.3 C) (Tympanic)   Resp 17   SpO2 94%    Physical Exam  Constitutional: Appears well-developed and well-nourished.  Psych: Affect appropriate to situation Eyes: No scleral injection HENT: No OP obstrucion Head: Normocephalic.  Cardiovascular: Normal rate and regular rhythm.  Respiratory: Effort normal and breath sounds normal to anterior ascultation GI: Soft.  No distension. There is no tenderness.  Skin: WDI  Neuro: Mental Status: unable to assess due to altered mental status Cranial Nerves: II: Visual Fields are full. Pupils are equal, round, and reactive to light. III,IV, VI: EOMI unable to assess due to altered mental status V: Facial sensation unable to assess due to altered mental status  VII: Face symmetric.  VIII: hearing unable to assess due to altered mental status Motor: Tone is normal. Bulk is normal. strength unable to assess due to altered mental status Sensory: Sensation unable to assess due to altered mental status Deep Tendon Reflexes: 2+ and symmetric in the biceps and patellae. Plantars: Toes are downgoing bilaterally. Cerebellar: unable to assess due to altered mental status  I have reviewed the images obtained:  NCT head normal noncontrast CT appearance of the brain. ASPECTS 10. CTA head and neck: Negative for large vessel occlusion. Generally mild atherosclerosis in  the head and neck. No significant arterial stenosis identified.   Impression: 54 y.o. man on eliquis arrives unresponsive after unwitnessed fall. Patient on DOAC not eligible for tPA, no LVO and therefore no intervention.  - Brain MRI  - Recommend TTE - Recommend ordering Lipid panel - Recommend Statin if LDL > 70 - Recommend obtaining HbA1c - Antithrombotic - continue anticoagulation. - SBP goal - permissive hypertension first 24 h < 220/110. Hold home meds.  - Telemetry monitoring for arrythmia - Recommend bedside Swallow screen - Recommend Stroke educatio - Recommend PT/OT/SLP consult  This patient is critically ill and at significant risk of neurological worsening, death and care requires constant monitoring of vital signs, hemodynamics,respiratory and cardiac monitoring, neurological assessment, discussion with family, other specialists and medical decision making of high complexity. I spent 75 minutes of neurocritical care time  in the care of  this patient. This was time spent independent of any time provided by nurse practitioner or PA.  Electronically signed by: Dr. Lynnae Sandhoff Pager: [phone] 12/01/2019, 7:27 PM

## 2019-12-02 ENCOUNTER — Observation Stay (HOSPITAL_COMMUNITY): Payer: Medicare Other

## 2019-12-02 DIAGNOSIS — R401 Stupor: Secondary | ICD-10-CM

## 2019-12-02 DIAGNOSIS — I10 Essential (primary) hypertension: Secondary | ICD-10-CM | POA: Diagnosis not present

## 2019-12-02 DIAGNOSIS — Z79899 Other long term (current) drug therapy: Secondary | ICD-10-CM | POA: Diagnosis not present

## 2019-12-02 DIAGNOSIS — F32A Depression, unspecified: Secondary | ICD-10-CM

## 2019-12-02 DIAGNOSIS — Z86711 Personal history of pulmonary embolism: Secondary | ICD-10-CM | POA: Diagnosis not present

## 2019-12-02 DIAGNOSIS — G9341 Metabolic encephalopathy: Secondary | ICD-10-CM | POA: Diagnosis present

## 2019-12-02 DIAGNOSIS — J438 Other emphysema: Secondary | ICD-10-CM | POA: Diagnosis present

## 2019-12-02 DIAGNOSIS — K219 Gastro-esophageal reflux disease without esophagitis: Secondary | ICD-10-CM | POA: Diagnosis present

## 2019-12-02 DIAGNOSIS — Z7951 Long term (current) use of inhaled steroids: Secondary | ICD-10-CM | POA: Diagnosis not present

## 2019-12-02 DIAGNOSIS — F17210 Nicotine dependence, cigarettes, uncomplicated: Secondary | ICD-10-CM | POA: Diagnosis present

## 2019-12-02 DIAGNOSIS — W19XXXA Unspecified fall, initial encounter: Secondary | ICD-10-CM | POA: Diagnosis not present

## 2019-12-02 DIAGNOSIS — J42 Unspecified chronic bronchitis: Secondary | ICD-10-CM | POA: Diagnosis not present

## 2019-12-02 DIAGNOSIS — R569 Unspecified convulsions: Secondary | ICD-10-CM | POA: Diagnosis not present

## 2019-12-02 DIAGNOSIS — R29735 NIHSS score 35: Secondary | ICD-10-CM | POA: Diagnosis present

## 2019-12-02 DIAGNOSIS — F13239 Sedative, hypnotic or anxiolytic dependence with withdrawal, unspecified: Secondary | ICD-10-CM | POA: Diagnosis not present

## 2019-12-02 DIAGNOSIS — Z7901 Long term (current) use of anticoagulants: Secondary | ICD-10-CM | POA: Diagnosis not present

## 2019-12-02 DIAGNOSIS — F13231 Sedative, hypnotic or anxiolytic dependence with withdrawal delirium: Secondary | ICD-10-CM | POA: Diagnosis not present

## 2019-12-02 DIAGNOSIS — E872 Acidosis: Secondary | ICD-10-CM | POA: Diagnosis present

## 2019-12-02 DIAGNOSIS — Z8719 Personal history of other diseases of the digestive system: Secondary | ICD-10-CM | POA: Diagnosis not present

## 2019-12-02 DIAGNOSIS — R410 Disorientation, unspecified: Secondary | ICD-10-CM

## 2019-12-02 DIAGNOSIS — Z20822 Contact with and (suspected) exposure to covid-19: Secondary | ICD-10-CM | POA: Diagnosis present

## 2019-12-02 DIAGNOSIS — Z800 Family history of malignant neoplasm of digestive organs: Secondary | ICD-10-CM | POA: Diagnosis not present

## 2019-12-02 DIAGNOSIS — D689 Coagulation defect, unspecified: Secondary | ICD-10-CM | POA: Diagnosis not present

## 2019-12-02 DIAGNOSIS — R55 Syncope and collapse: Secondary | ICD-10-CM

## 2019-12-02 DIAGNOSIS — Y92009 Unspecified place in unspecified non-institutional (private) residence as the place of occurrence of the external cause: Secondary | ICD-10-CM | POA: Diagnosis not present

## 2019-12-02 DIAGNOSIS — Z880 Allergy status to penicillin: Secondary | ICD-10-CM | POA: Diagnosis not present

## 2019-12-02 DIAGNOSIS — R4182 Altered mental status, unspecified: Secondary | ICD-10-CM

## 2019-12-02 DIAGNOSIS — F410 Panic disorder [episodic paroxysmal anxiety] without agoraphobia: Secondary | ICD-10-CM | POA: Diagnosis present

## 2019-12-02 DIAGNOSIS — Z23 Encounter for immunization: Secondary | ICD-10-CM | POA: Diagnosis present

## 2019-12-02 DIAGNOSIS — Z6833 Body mass index (BMI) 33.0-33.9, adult: Secondary | ICD-10-CM | POA: Diagnosis not present

## 2019-12-02 DIAGNOSIS — E785 Hyperlipidemia, unspecified: Secondary | ICD-10-CM | POA: Diagnosis not present

## 2019-12-02 DIAGNOSIS — Z885 Allergy status to narcotic agent status: Secondary | ICD-10-CM | POA: Diagnosis not present

## 2019-12-02 DIAGNOSIS — K589 Irritable bowel syndrome without diarrhea: Secondary | ICD-10-CM | POA: Diagnosis present

## 2019-12-02 DIAGNOSIS — J3489 Other specified disorders of nose and nasal sinuses: Secondary | ICD-10-CM | POA: Diagnosis not present

## 2019-12-02 LAB — ECHOCARDIOGRAM COMPLETE
AR max vel: 2 cm2
AV Area VTI: 1.85 cm2
AV Area mean vel: 2.01 cm2
AV Mean grad: 7 mmHg
AV Peak grad: 13 mmHg
Ao pk vel: 1.8 m/s
Area-P 1/2: 3.72 cm2
S' Lateral: 3 cm

## 2019-12-02 LAB — I-STAT ARTERIAL BLOOD GAS, ED
Acid-Base Excess: 3 mmol/L — ABNORMAL HIGH (ref 0.0–2.0)
Bicarbonate: 29.3 mmol/L — ABNORMAL HIGH (ref 20.0–28.0)
Calcium, Ion: 1.16 mmol/L (ref 1.15–1.40)
HCT: 41 % (ref 39.0–52.0)
Hemoglobin: 13.9 g/dL (ref 13.0–17.0)
O2 Saturation: 96 %
Patient temperature: 98.6
Potassium: 3.8 mmol/L (ref 3.5–5.1)
Sodium: 136 mmol/L (ref 135–145)
TCO2: 31 mmol/L (ref 22–32)
pCO2 arterial: 49.7 mmHg — ABNORMAL HIGH (ref 32.0–48.0)
pH, Arterial: 7.378 (ref 7.350–7.450)
pO2, Arterial: 84 mmHg (ref 83.0–108.0)

## 2019-12-02 LAB — HIV ANTIBODY (ROUTINE TESTING W REFLEX): HIV Screen 4th Generation wRfx: NONREACTIVE

## 2019-12-02 LAB — CK: Total CK: 71 U/L (ref 49–397)

## 2019-12-02 LAB — SALICYLATE LEVEL: Salicylate Lvl: <7 mg/dL — ABNORMAL LOW (ref 7.0–30.0)

## 2019-12-02 LAB — AMMONIA: Ammonia: 46 umol/L — ABNORMAL HIGH (ref 9–35)

## 2019-12-02 LAB — ACETAMINOPHEN LEVEL: Acetaminophen (Tylenol), Serum: <10 ug/mL — ABNORMAL LOW (ref 10–30)

## 2019-12-02 MED ORDER — MOMETASONE FURO-FORMOTEROL FUM 200-5 MCG/ACT IN AERO
2.0000 | INHALATION_SPRAY | Freq: Two times a day (BID) | RESPIRATORY_TRACT | Status: DC
  Administered 2019-12-02 – 2019-12-12 (×16): 2 via RESPIRATORY_TRACT
  Filled 2019-12-02 (×2): qty 8.8

## 2019-12-02 MED ORDER — SIMVASTATIN 20 MG PO TABS
20.0000 mg | ORAL_TABLET | Freq: Every day | ORAL | Status: DC
  Administered 2019-12-02 – 2019-12-11 (×7): 20 mg via ORAL
  Filled 2019-12-02 (×7): qty 1

## 2019-12-02 MED ORDER — APIXABAN 2.5 MG PO TABS
2.5000 mg | ORAL_TABLET | Freq: Two times a day (BID) | ORAL | Status: DC
  Administered 2019-12-02: 2.5 mg via ORAL
  Filled 2019-12-02 (×3): qty 1

## 2019-12-02 MED ORDER — PANTOPRAZOLE SODIUM 40 MG PO TBEC
40.0000 mg | DELAYED_RELEASE_TABLET | Freq: Every day | ORAL | Status: DC
  Administered 2019-12-02 – 2019-12-12 (×10): 40 mg via ORAL
  Filled 2019-12-02 (×10): qty 1

## 2019-12-02 MED ORDER — ALBUTEROL SULFATE (2.5 MG/3ML) 0.083% IN NEBU
2.5000 mg | INHALATION_SOLUTION | RESPIRATORY_TRACT | Status: DC | PRN
  Administered 2019-12-02 – 2019-12-06 (×3): 2.5 mg via RESPIRATORY_TRACT
  Filled 2019-12-02 (×4): qty 3

## 2019-12-02 MED ORDER — SODIUM CHLORIDE 0.9 % IV SOLN: INTRAVENOUS | Status: AC

## 2019-12-02 MED ORDER — OLMESARTAN MEDOXOMIL-HCTZ 20-12.5 MG PO TABS: 1.0000 | ORAL_TABLET | Freq: Every day | ORAL | Status: DC

## 2019-12-02 MED ORDER — IRBESARTAN 150 MG PO TABS
150.0000 mg | ORAL_TABLET | Freq: Every day | ORAL | Status: DC
  Administered 2019-12-02 – 2019-12-12 (×10): 150 mg via ORAL
  Filled 2019-12-02 (×10): qty 1

## 2019-12-02 MED ORDER — CITALOPRAM HYDROBROMIDE 10 MG PO TABS
20.0000 mg | ORAL_TABLET | Freq: Every day | ORAL | Status: DC
  Administered 2019-12-03 – 2019-12-12 (×9): 20 mg via ORAL
  Filled 2019-12-02 (×9): qty 2

## 2019-12-02 MED ORDER — SIMVASTATIN 20 MG PO TABS: 20.0000 mg | ORAL_TABLET | Freq: Every day | ORAL | Status: DC

## 2019-12-02 MED ORDER — CITALOPRAM HYDROBROMIDE 40 MG PO TABS
40.0000 mg | ORAL_TABLET | Freq: Every day | ORAL | Status: DC
  Administered 2019-12-02 – 2019-12-11 (×10): 40 mg via ORAL
  Filled 2019-12-02 (×7): qty 1
  Filled 2019-12-02: qty 4
  Filled 2019-12-02 (×3): qty 1

## 2019-12-02 MED ORDER — FLUMAZENIL 0.5 MG/5ML IV SOLN
0.2000 mg | Freq: Once | INTRAVENOUS | Status: AC
  Administered 2019-12-02: 0.2 mg via INTRAVENOUS

## 2019-12-02 MED ORDER — HYDROCHLOROTHIAZIDE 12.5 MG PO CAPS
12.5000 mg | ORAL_CAPSULE | Freq: Every day | ORAL | Status: DC
  Filled 2019-12-02 (×3): qty 1

## 2019-12-02 MED ORDER — CITALOPRAM HYDROBROMIDE 10 MG PO TABS: 20.0000 mg | ORAL_TABLET | ORAL | Status: DC

## 2019-12-02 MED ORDER — ALPRAZOLAM 0.5 MG PO TABS
0.5000 mg | ORAL_TABLET | Freq: Three times a day (TID) | ORAL | Status: DC | PRN
  Administered 2019-12-02 – 2019-12-03 (×2): 0.5 mg via ORAL
  Filled 2019-12-02 (×2): qty 1

## 2019-12-02 MED ORDER — APIXABAN 5 MG PO TABS
5.0000 mg | ORAL_TABLET | Freq: Two times a day (BID) | ORAL | Status: DC
  Administered 2019-12-02 – 2019-12-03 (×3): 5 mg via ORAL
  Filled 2019-12-02 (×3): qty 1

## 2019-12-02 NOTE — Procedures (Signed)
Patient Name: Jason Barajas  MRN: [id number]  Epilepsy Attending: Lora Havens  Referring Physician/Provider: Dr Derrick Ravel Date: 12/02/2019  Duration: 29.29 mins  Patient history: 54yo M with ams. EEG to evaluate for seizure.   Level of alertness: Awake  AEDs during EEG study: None  Technical aspects: This EEG study was done with scalp electrodes positioned according to the 10-20 International system of electrode placement. Electrical activity was acquired at a sampling rate of 500Hz  and reviewed with a high frequency filter of 70Hz  and a low frequency filter of 1Hz . EEG data were recorded continuously and digitally stored.   Description: The posterior dominant rhythm consists of 8-9 Hz activity of moderate voltage (25-35 uV) seen predominantly in posterior head regions, symmetric and reactive to eye opening and eye closing. Physiologic photic driving was not seen during photic stimulation.  Hyperventilation was not performed.     IMPRESSION: This study is within normal limits.No seizures or epileptiform discharges were seen throughout the recording.  [name] [name]

## 2019-12-02 NOTE — Progress Notes (Signed)
STROKE TEAM PROGRESS NOTE   INTERVAL HISTORY His wife and RN are at the bedside.  Pt was on CPAP when I entered room, RN later removed CPAP per primary team orders. Pt then put on [place]. He is much awake alert and communicate, although still slow talking with stuttering and dysarthria. Moving all extremities but has significant asterixis.   Per wife, pt normally takes Xanax up to 5 times a day. Yesterday he only took once. In the morning he felt lightheaded on standing up and fell. Then he was Ok until in the evening, he was talking to neighbor, acute onset slurry speech, fell down again, glazed eyes, not able to talk, not answer questions, but did not pass out or shaking jerking, EMS called. BP 115/70. Overnight, he gradually improved but still not baseline yet.   Per wife, he had DVT/PE in 04/2016 and was discharged with 5mg  bid, but in 2019 he had rectal bleeding and his eliquis was changed to 2.5mg  bid since. CT, CTA head and neck, MRI all negative. EEG no seizure.  OBJECTIVE Vitals:   12/02/19 0546 12/02/19 0551 12/02/19 0605 12/02/19 0754  BP:   106/73 122/85  Pulse: 74 66 65 65  Resp: (!) 23 15 16 16   Temp:   98.3 F (36.8 C) 97.6 F (36.4 C)  TempSrc:   Axillary Axillary  SpO2: 96% 96% 97% 98%    CBC:  Recent Labs  Lab 12/01/19 1839 12/01/19 1840 12/01/19 2020 12/02/19 0218  WBC 8.3  --   --   --   NEUTROABS 4.8  --   --   --   HGB 14.7   < > 14.3 13.9  HCT 46.2   < > 42.0 41.0  MCV 98.3  --   --   --   PLT 187  --   --   --    < > = values in this interval not displayed.    Basic Metabolic Panel:  Recent Labs  Lab 12/01/19 1839 12/01/19 1839 12/01/19 1840 12/01/19 1840 12/01/19 2020 12/02/19 0218  NA 136   < > 137   < > 137 136  K 3.9   < > 3.8   < > 3.8 3.8  CL 101  --  99  --   --   --   CO2 23  --   --   --   --   --   GLUCOSE 78  --  76  --   --   --   BUN 8  --  9  --   --   --   CREATININE 0.77  --  0.80  --   --   --   CALCIUM 8.5*  --   --   --   --    --    < > = values in this interval not displayed.    Lipid Panel: No results found for: CHOL, TRIG, HDL, CHOLHDL, VLDL, LDLCALC HgbA1c: No results found for: HGBA1C Urine Drug Screen:     Component Value Date/Time   LABOPIA NONE DETECTED 12/01/2019 1918   COCAINSCRNUR NONE DETECTED 12/01/2019 1918   LABBENZ POSITIVE (A) 12/01/2019 1918   AMPHETMU NONE DETECTED 12/01/2019 1918   THCU NONE DETECTED 12/01/2019 1918   LABBARB NONE DETECTED 12/01/2019 1918    Alcohol Level     Component Value Date/Time   ETH <10 12/01/2019 2024    IMAGING  CT Cervical Spine Wo Contrast Result Date: 12/01/2019 CLINICAL  DATA:  Neck trauma EXAM: CT CERVICAL SPINE WITHOUT CONTRAST TECHNIQUE: Multidetector CT imaging of the cervical spine was performed without intravenous contrast. Multiplanar CT image reconstructions were also generated. COMPARISON:  None. FINDINGS: Alignment: No subluxation.  Facet alignment within normal limits. Skull base and vertebrae: No acute fracture. No primary bone lesion or focal pathologic process. Soft tissues and spinal canal: No prevertebral fluid or swelling. No visible canal hematoma. Disc levels:  Mild diffuse degenerative changes at multiple levels. Upper chest: Negative. Other: None IMPRESSION: Mild degenerative changes of the cervical spine. No acute osseous abnormality. Electronically Signed   By: Donavan Foil M.D.   On: 12/01/2019 19:17   MR BRAIN WO CONTRAST Result Date: 12/01/2019 CLINICAL DATA:  54 year old male code stroke presentation. EXAM: MRI HEAD WITHOUT CONTRAST TECHNIQUE: Multiplanar, multiecho pulse sequences of the brain and surrounding structures were obtained without intravenous contrast. COMPARISON:  Plain head CT and CTA head and neck. Brain MRI 11/06/2004. FINDINGS: Brain: No restricted diffusion to suggest acute infarction. No midline shift, mass effect, evidence of mass lesion, ventriculomegaly, extra-axial collection or acute intracranial hemorrhage.  Cervicomedullary junction and pituitary are within normal limits. Pearline Cables and white matter signal remains within normal limits throughout the brain. No convincing encephalomalacia. No chronic cerebral blood products. Vascular: Major intracranial vascular flow voids are stable since 2006. Skull and upper cervical spine: Negative. Sinuses/Orbits: Negative. Other: Mastoids are clear. Grossly negative visible internal auditory structures, scalp and face soft tissues. IMPRESSION: Normal noncontrast MRI appearance of the brain. Electronically Signed   By: Genevie Ann M.D.   On: 12/01/2019 21:52   DG Chest Portable 1 View Result Date: 12/01/2019 CLINICAL DATA:  Shortness of breath EXAM: PORTABLE CHEST 1 VIEW COMPARISON:  CT 09/14/2018, radiograph 09/14/2018 FINDINGS: Chronically coarsened interstitial changes towards the lung bases with apical lucency compatible with areas of emphysematous change and basilar scarring particularly within the lingula. These are not significantly changed from comparison radiography. No new consolidative opacity. The cardiomediastinal contours are unremarkable. Telemetry leads overlie the chest. No acute osseous or soft tissue abnormality. Degenerative changes are present in the imaged spine and shoulders. IMPRESSION: Chronically coarsened interstitial changes and apical lucency compatible with areas of basilar scarring and emphysematous features better seen on comparison CT. No acute cardiopulmonary abnormality. Electronically Signed   By: Lovena Le M.D.   On: 12/01/2019 19:34   CT HEAD CODE STROKE WO CONTRAST Result Date: 12/01/2019 CLINICAL DATA:  Code stroke.  54 year old male EXAM: CT HEAD WITHOUT CONTRAST TECHNIQUE: Contiguous axial images were obtained from the base of the skull through the vertex without intravenous contrast. COMPARISON:  Brain MRI 11/07/2003. FINDINGS: Brain: Cerebral volume is not significantly changed since 2006. No midline shift, ventriculomegaly, mass effect,  evidence of mass lesion, intracranial hemorrhage or evidence of cortically based acute infarction. Gray-white matter differentiation is within normal limits throughout the brain. No cortical encephalomalacia identified. Vascular: Calcified atherosclerosis at the skull base. No suspicious intracranial vascular hyperdensity. Skull: Negative. Sinuses/Orbits: Visualized paranasal sinuses and mastoids are clear. Other: Visualized orbits and scalp soft tissues are within normal limits. ASPECTS [facility] Stroke Program Early CT Score) Total score (0-10 with 10 being normal): 10 IMPRESSION: 1. Normal noncontrast CT appearance of the brain. ASPECTS 10. 2. These results were communicated to Dr. Theda Sers at 7:03 pm on 12/01/2019 by text page via the [facility] messaging system. Electronically Signed   By: Genevie Ann M.D.   On: 12/01/2019 19:03   CT ANGIO HEAD CODE STROKE Result Date: 12/01/2019 CLINICAL  DATA:  54 year old male code stroke presentation. EXAM: CT ANGIOGRAPHY HEAD AND NECK TECHNIQUE: Multidetector CT imaging of the head and neck was performed using the standard protocol during bolus administration of intravenous contrast. Multiplanar CT image reconstructions and MIPs were obtained to evaluate the vascular anatomy. Carotid stenosis measurements (when applicable) are obtained utilizing NASCET criteria, using the distal internal carotid diameter as the denominator. CONTRAST:  48mL OMNIPAQUE IOHEXOL 350 MG/ML SOLN COMPARISON:  Plain head CT 1855 hours today. FINDINGS: CTA NECK Skeleton: No acute osseous abnormality identified. Upper chest: Paraseptal emphysema in the medial left upper lobe. Atelectatic changes to the trachea. No superior mediastinal lymphadenopathy. Other neck: Reflux of venous contrast from the injected left subclavian into the paravertebral venous system greater on the left. No acute finding in the neck. Aortic arch: 4 vessel arch configuration. The left vertebral arises directly from the arch. Soft plaque  in the arch and at most of the great vessel origins. Right carotid system: Soft plaque at the brachiocephalic artery origin without significant stenosis. Normal right CCA. Negative right carotid bifurcation and cervical right ICA. Left carotid system: Soft plaque at the left CCA origin without stenosis. Mild plaque at the left ICA origin and bulb without stenosis. Vertebral arteries: Mild plaque in the proximal right subclavian artery without stenosis. Normal right vertebral artery origin. Right vertebral is patent and normal to the skull base. Left vertebral arises directly from the arch with minimal plaque at its origin. Patent left vertebral to the skull base with no plaque or stenosis identified. CTA HEAD Posterior circulation: Patent distal vertebral arteries and vertebrobasilar junction. The right V4 segment is mildly dominant. There is mild irregularity and stenosis in the distal left V4 segment. Normal right PICA origin. The left AICA may be dominant. Patent SCA and PCA origins. Posterior communicating arteries are diminutive or absent. Bilateral PCA branches are within normal limits. Anterior circulation: Both ICA siphons are patent. On the left there is mild plaque without stenosis. Minimal plaque on the right without stenosis. Patent carotid termini. Patent MCA and ACA origins. Mildly dominant left A1. Anterior communicating artery and bilateral ACA branches are within normal limits. Left MCA M1 segment and trifurcation are patent without stenosis. Left MCA branches are within normal limits. Right MCA M1 segment and bifurcation are patent without stenosis. Right MCA branches are within normal limits. Venous sinuses: Patent. Anatomic variants: Left vertebral artery arises directly from the arch and is mildly non dominant. Mildly dominant left ACA A1. Review of the MIP images confirms the above findings IMPRESSION: 1. Negative for large vessel occlusion. 2. Generally mild atherosclerosis in the head and  neck. No significant arterial stenosis identified. 3. Emphysema (ICD10-J43.9). Vascular results were communicated to Dr. Lorrin Goodell at 7:20 pmon 10/1/2021by text page via the [facility] messaging system. Electronically Signed   By: Genevie Ann M.D.   On: 12/01/2019 19:20   CT ANGIO NECK CODE STROKE Result Date: 12/01/2019 CLINICAL DATA:  54 year old male code stroke presentation. EXAM: CT ANGIOGRAPHY HEAD AND NECK TECHNIQUE: Multidetector CT imaging of the head and neck was performed using the standard protocol during bolus administration of intravenous contrast. Multiplanar CT image reconstructions and MIPs were obtained to evaluate the vascular anatomy. Carotid stenosis measurements (when applicable) are obtained utilizing NASCET criteria, using the distal internal carotid diameter as the denominator. CONTRAST:  81mL OMNIPAQUE IOHEXOL 350 MG/ML SOLN COMPARISON:  Plain head CT 1855 hours today. FINDINGS: CTA NECK Skeleton: No acute osseous abnormality identified. Upper chest: Paraseptal emphysema in the  medial left upper lobe. Atelectatic changes to the trachea. No superior mediastinal lymphadenopathy. Other neck: Reflux of venous contrast from the injected left subclavian into the paravertebral venous system greater on the left. No acute finding in the neck. Aortic arch: 4 vessel arch configuration. The left vertebral arises directly from the arch. Soft plaque in the arch and at most of the great vessel origins. Right carotid system: Soft plaque at the brachiocephalic artery origin without significant stenosis. Normal right CCA. Negative right carotid bifurcation and cervical right ICA. Left carotid system: Soft plaque at the left CCA origin without stenosis. Mild plaque at the left ICA origin and bulb without stenosis. Vertebral arteries: Mild plaque in the proximal right subclavian artery without stenosis. Normal right vertebral artery origin. Right vertebral is patent and normal to the skull base. Left vertebral  arises directly from the arch with minimal plaque at its origin. Patent left vertebral to the skull base with no plaque or stenosis identified. CTA HEAD Posterior circulation: Patent distal vertebral arteries and vertebrobasilar junction. The right V4 segment is mildly dominant. There is mild irregularity and stenosis in the distal left V4 segment. Normal right PICA origin. The left AICA may be dominant. Patent SCA and PCA origins. Posterior communicating arteries are diminutive or absent. Bilateral PCA branches are within normal limits. Anterior circulation: Both ICA siphons are patent. On the left there is mild plaque without stenosis. Minimal plaque on the right without stenosis. Patent carotid termini. Patent MCA and ACA origins. Mildly dominant left A1. Anterior communicating artery and bilateral ACA branches are within normal limits. Left MCA M1 segment and trifurcation are patent without stenosis. Left MCA branches are within normal limits. Right MCA M1 segment and bifurcation are patent without stenosis. Right MCA branches are within normal limits. Venous sinuses: Patent. Anatomic variants: Left vertebral artery arises directly from the arch and is mildly non dominant. Mildly dominant left ACA A1. Review of the MIP images confirms the above findings IMPRESSION: 1. Negative for large vessel occlusion. 2. Generally mild atherosclerosis in the head and neck. No significant arterial stenosis identified. 3. Emphysema (ICD10-J43.9). Vascular results were communicated to Dr. Lorrin Goodell at 7:20 pmon 10/1/2021by text page via the [facility] messaging system. Electronically Signed   By: Genevie Ann M.D.   On: 12/01/2019 19:20   Transthoracic Echocardiogram  1. Left ventricular ejection fraction, by estimation, is 60 to 65%. The  left ventricle has normal function. The left ventricle has no regional  wall motion abnormalities. There is moderate left ventricular hypertrophy.  Left ventricular diastolic  parameters were  normal.  2. Right ventricular systolic function is normal. The right ventricular  size is normal.  3. The mitral valve is normal in structure. No evidence of mitral valve  regurgitation. No evidence of mitral stenosis.  4. The aortic valve has an indeterminant number of cusps. There is mild  calcification of the aortic valve. There is mild thickening of the aortic  valve. Aortic valve regurgitation is not visualized. No aortic stenosis is  present.  5. The inferior vena cava is normal in size with greater than 50%  respiratory variability, suggesting right atrial pressure of 3 mmHg.   ECG - SR rate 70 BPM. (See cardiology reading for complete details)  EEG This study is within normal limits.No seizures or epileptiform discharges were seen throughout the recording.   PHYSICAL EXAM  Temp:  [97.4 F (36.3 C)-98.6 F (37 C)] 98.2 F (36.8 C) (10/02 1310) Pulse Rate:  [59-80] 77 (10/02  1310) Resp:  [13-31] 17 (10/02 1310) BP: (103-136)/(50-89) 118/89 (10/02 1310) SpO2:  [93 %-100 %] 95 % (10/02 1357) FiO2 (%):  [40 %] 40 % (10/01 2238)  General - Well nourished, well developed, in no apparent distress, lethargic.  Ophthalmologic - fundi not visualized due to noncooperation.  Cardiovascular - Regular rhythm and rate.  Neuro - awake alert, eyes open, orientated to place, people, and time. Psychomotor slowing but following all simple commands, able to name and repeat. Paucity of speech with intermittent stuttering. PERRL, EOMI, visual field full. Facial symmetrical, tongue midline. BUE 4/5 but significant asterixis. BLE 3/5 proximal and 4/5 distally, mild asterixis. FTN slow but grossly intact once taking into consideration of asterixis. Sensation symmetrical. Gait not tested.    ASSESSMENT/PLAN Mr. ORLEN LEEDY is a 54 y.o. male with history of DVT, PE, clotting disorder (Eliquis), COPD, tobacco use, HTN, and HLD brought to [facility] after he was found down and unresponsive by his  family.Marland Kitchen He did not receive IV t-PA due to Eliquis therapy.   Seizure vs. Pre-syncope vs. Encephalopathy due to medication   Resultant fall at home x 2, AMS, non-focal, asterixis  CT Head - Normal noncontrast CT appearance of the brain. A  MRI head - Normal noncontrast MRI appearance of the brain.  CTA H&N - Negative for large vessel occlusion. Generally mild atherosclerosis in the head and neck.   2D Echo - EF 60-65%  EEG - no seizure  [employer] Virus 2 - negative  LDL - pending  HgbA1c - pending  UDS - benzo + (on home Xanax)  VTE prophylaxis - Eliquis 2.5mg  bid  Eliquis 2.5 bid prior to admission, now on Eliquis 5 bid. Continue on discharge.   Do not feel long term AED is needed at this time.  Patient counseled to be compliant with his antithrombotic medications  Ongoing aggressive stroke risk factor management  Therapy recommendations:  pending  Disposition:  Pending  Hx of DVT/PE  04/2016 admitted in [facility] for PE.  Put on Eliquis 5 mg twice daily on discharge  Per pharmacy, in 2019, patient had rectal bleeding, Eliquis down to 2.5 twice daily  Discussed with pharmacy, will re-challenging with Eliquis 5 mg twice daily  Close monitoring bleeding  Anxiety  Home Xanax up to 5 times a day as needed  UDS positive for benzo  Wife stated patient only took 1 dose yesterday  ??  Benzo withdrawal  Resume celexa   Management per primary team  Hypertension  Home BP meds: irbesartan ; HCTZ  Current BP meds: olmasartan ; HCTZ  Stable - mildly low at times . Long-term BP goal normotensive  Hyperlipidemia  Home Lipid lowering medication: Zocor 20 mg daily  LDL - pending, goal < 70  Resumed zocor 20  Continue statin at discharge  Tobacco abuse  Current smoker  Smoking cessation counseling provided  Pt is willing to quit  Other Stroke Risk Factor  Previous ETOH   Obesity, recommend weight loss, diet and exercise as appropriate   Other  Active Problems  Code status - Full code   Hospital day # 0  Rosalin Hawking, MD PhD Stroke Neurology 12/02/2019 4:31 PM    To contact Stroke Continuity provider, please refer to [email or web address] After hours, contact General Neurology

## 2019-12-02 NOTE — ED Notes (Signed)
Report called to floor, pt has eyes open, lifting head off bed spontaneously. Continues to be non verbal and will not follow commands. Strong cough. Resistance in extremities. RT notified of bipap and transport, okay to transport off bipap

## 2019-12-02 NOTE — Progress Notes (Signed)
BiPAP removed per hospitalist request. Patient on 4 L [place] O2 saturation 94%.

## 2019-12-02 NOTE — Progress Notes (Signed)
EEG complete - results pending 

## 2019-12-02 NOTE — Progress Notes (Signed)
PROGRESS NOTE    Jason Barajas  OEU:[id number] DOB: 1966-01-16 DOA: 12/01/2019 PCP: Ronita Hipps, MD   Brief Narrative:  Jason Barajas is a 54 y.o. male with medical history significant of anxiety, depression, COPD, PE/DVT on Eliquis, GERD, hypertension, hyperlipidemia, IBS presenting to the ED with altered mental status. Patient is very somnolent and no history could be obtained from him. History provided by wife at bedside who states that yesterday patient had an unwitnessed fall. Then again today he had another unwitnessed fall. States this evening patient was acting normal and talking to her and another neighbor. Then all of a sudden his speech became slurred and he became unresponsive.  His eyes were still open and he did not lose consciousness.  He takes Xanax 1 mg up to 5 times a day for anxiety.  He also takes gabapentin for panic attacks.  States she fill out his pillbox and keeps the rest of the medication with her. She is not sure how much Xanax he took today. Wife states patient has depression for which he takes citalopram and is seen by a psychiatrist. States patient has not expressed any suicidal thoughts. States he has chronic shortness of breath due to COPD but has not had any fevers, cough, nausea, vomiting, or diarrhea.  He has not complained of any abdominal pain.  Wife confirms that he does not drink any alcohol.  He has been vaccinated against Covid. In [id number] code stroke was activated and patient was seen by neurology.  TPA not given as he is on Eliquis.  Head CT normal.  CT angiogram head and neck negative for LVO.  Brain MRI normal. WBC 8.3, hemoglobin 14.7, hematocrit 46.2, platelet 187K.  Sodium 136, potassium 3.9, chloride 101, bicarb 23, BUN 8, creatinine 0.7, glucose 78.  LFTs normal.  INR 1.1.  UA not suggestive of infection.  UDS positive for benzodiazepines (patient takes Xanax at home).  SARS-CoV-2 PCR test negative.  Influenza panel negative.  Blood ethanol level  undetectable.  Salicylate and acetaminophen levels pending.  Chest x-ray negative for acute cardiopulmonary abnormality.  CT C-spine negative for acute osseous abnormality.  EKG without acute changes. ABG with pH 7.33, PCO2 54, PO2 60.  He was placed on BiPAP briefly which did not improve his mental status. Patient received 1 L IV fluid bolus.   Assessment & Plan:   Principal Problem:   AMS (altered mental status) Active Problems:   COPD (chronic obstructive pulmonary disease) (HCC)   Fall   History of pulmonary embolism   Depression   Acute metabolic encephalopathy, likely multifactorial Possible polypharmacy Rule out COPD exacerbation and hypercarbia -Patient much more awake alert this morning still somewhat somnolent but able to talk over BiPAP and make a joke which wife states is much closer to his baseline -Wife indicates she found his home Xanax bottle as well as a new bottle of carisoprodol concerning for polypharmacy(he is also on gabapentin and citalopram)-continue to hold these medications as well as other sedating medications.  This may explain his minimal improvement with flumazenil given not simply a benzodiazepine overdose. - CT head neck MRI without acute findings/process -Given atypical findings with fall, mental status changes and slurred speech with questionable weakness seizure with Todd's paralysis is certainly reasonable, EEG pending today for further evaluation -Continue BiPAP, will attempt to wean off this afternoon after EEG has been completed and monitor for oxygen and respiratory symptoms.  Certainly could be elevated CO2 levels in the setting of  COPD exacerbation although would not explain patient's mechanical falls slurred speech or unilateral weakness prior to admission.  CO2 minimally elevated at 54 prior to BiPAP -would certainly have normalized by now -Home med rec also includes citalopram at home but serotonin syndrome less likely given stable vital signs: no  fever, diaphoresis, or agitation.  Wife confirms that patient does not drink alcohol. - Neurology following appreciate insight and recommendations -Salicylate, acetaminophen, CK, within normal limits, ammonia minimally elevated at 46, would not explain his current mental status  Unwitnessed falls: Unclear if these were syncopal events.  EKG not suggestive of arrhythmia.  PE less likely given no tachycardia and he is already on chronic anticoagulation with Eliquis. -Continue cardiac monitoring.  Order echocardiogram.  Unable to check orthostatics at this time.  Will need PT/OT evaluation after his mental status improves.  Anxiety, depression -Hold home medications at this time  COPD:  -No acute exacerbation, given minimally elevated CO2 at admission at 69 and only mildly acidotic would presume his CO2 baseline is around 50  History of PE/DVT -Continue Eliquis  DVT prophylaxis: On chronic anticoagulation with Eliquis Code Status: Full code Family Communication: Wife at bedside  Status is: Inpatient  Dispo: The patient is from: Home              Anticipated d/c is to: To be determined              Anticipated d/c date is: 48 to 96 hours pending clinical course and further findings              Patient currently not medically stable for discharge given ongoing respiratory status and severe hypoxia requiring BiPAP and high flow oxygen.  Consultants:   Neurology  Procedures:   EEG, echocardiogram  Antimicrobials:  Not indicated  Subjective: No acute issues or events overnight, patient much more awake and alert this morning but not yet back to baseline per wife at bedside.  Still somewhat somnolent, answers simple questions such as his name correctly but otherwise falls asleep during exam.  Objective: Vitals:   12/02/19 0546 12/02/19 0551 12/02/19 0605 12/02/19 0754  BP:   106/73 122/85  Pulse: 74 66 65 65  Resp: (!) 23 15 16 16   Temp:   98.3 F (36.8 C) 97.6 F (36.4  C)  TempSrc:   Axillary Axillary  SpO2: 96% 96% 97% 98%    Intake/Output Summary (Last 24 hours) at 12/02/2019 0905 Last data filed at 12/02/2019 0500 Gross per 24 hour  Intake 1259.89 ml  Output --  Net 1259.89 ml   There were no vitals filed for this visit.  Examination:  General: Pleasantly resting in bed on BiPAP without acute distress HEENT:  Normocephalic atraumatic.  Sclerae nonicteric, noninjected.  Extraocular movements intact bilaterally. Neck:  Without mass or deformity.  Trachea is midline. Lungs:  Clear to auscultate bilaterally without rhonchi, wheeze, or rales. Heart:  Regular rate and rhythm.  Without murmurs, rubs, or gallops. Abdomen:  Soft, nontender, nondistended.  Without guarding or rebound. Extremities: Without cyanosis, clubbing, edema, or obvious deformity. Vascular:  Dorsalis pedis and posterior tibial pulses palpable bilaterally. Skin:  Warm and dry, no erythema, no ulcerations.  Data Reviewed: I have personally reviewed following labs and imaging studies  CBC: Recent Labs  Lab 12/01/19 1839 12/01/19 1840 12/01/19 2020 12/02/19 0218  WBC 8.3  --   --   --   NEUTROABS 4.8  --   --   --   HGB  14.7 15.3 14.3 13.9  HCT 46.2 45.0 42.0 41.0  MCV 98.3  --   --   --   PLT 187  --   --   --    Basic Metabolic Panel: Recent Labs  Lab 12/01/19 1839 12/01/19 1840 12/01/19 2020 12/02/19 0218  NA 136 137 137 136  K 3.9 3.8 3.8 3.8  CL 101 99  --   --   CO2 23  --   --   --   GLUCOSE 78 76  --   --   BUN 8 9  --   --   CREATININE 0.77 0.80  --   --   CALCIUM 8.5*  --   --   --    GFR: CrCl cannot be calculated (Unknown ideal weight.). Liver Function Tests: Recent Labs  Lab 12/01/19 1839  AST 15  ALT 15  ALKPHOS 54  BILITOT 0.8  PROT 6.1*  ALBUMIN 3.5   No results for input(s): LIPASE, AMYLASE in the last 168 hours. Recent Labs  Lab 12/02/19 0046  AMMONIA 46*   Coagulation Profile: Recent Labs  Lab 12/01/19 1839  INR 1.1    Cardiac Enzymes: Recent Labs  Lab 12/02/19 0157  CKTOTAL 71   BNP (last 3 results) No results for input(s): PROBNP in the last 8760 hours. HbA1C: No results for input(s): HGBA1C in the last 72 hours. CBG: Recent Labs  Lab 12/01/19 1833  GLUCAP 76   Lipid Profile: No results for input(s): CHOL, HDL, LDLCALC, TRIG, CHOLHDL, LDLDIRECT in the last 72 hours. Thyroid Function Tests: No results for input(s): TSH, T4TOTAL, FREET4, T3FREE, THYROIDAB in the last 72 hours. Anemia Panel: No results for input(s): VITAMINB12, FOLATE, FERRITIN, TIBC, IRON, RETICCTPCT in the last 72 hours. Sepsis Labs: No results for input(s): PROCALCITON, LATICACIDVEN in the last 168 hours.  Recent Results (from the past 240 hour(s))  Respiratory Panel by RT PCR (Flu A&B, Covid) - Nasopharyngeal Swab     Status: None   Collection Time: 12/01/19  7:28 PM   Specimen: Nasopharyngeal Swab  Result Value Ref Range Status   SARS Coronavirus 2 by RT PCR NEGATIVE NEGATIVE Final    Comment: (NOTE) SARS-CoV-2 target nucleic acids are NOT DETECTED.  The SARS-CoV-2 RNA is generally detectable in upper respiratoy specimens during the acute phase of infection. The lowest concentration of SARS-CoV-2 viral copies this assay can detect is 131 copies/mL. A negative result does not preclude SARS-Cov-2 infection and should not be used as the sole basis for treatment or other patient management decisions. A negative result may occur with  improper specimen collection/handling, submission of specimen other than nasopharyngeal swab, presence of viral mutation(s) within the areas targeted by this assay, and inadequate number of viral copies (<131 copies/mL). A negative result must be combined with clinical observations, patient history, and epidemiological information. The expected result is Negative.  Fact Sheet for Patients:  PinkCheek.be  Fact Sheet for Healthcare Providers:   GravelBags.it  This test is no t yet approved or cleared by the [place] FDA and  has been authorized for detection and/or diagnosis of SARS-CoV-2 by FDA under an Emergency Use Authorization (EUA). This EUA will remain  in effect (meaning this test can be used) for the duration of the COVID-19 declaration under Section 564(b)(1) of the Act, 21 U.S.C. section 360bbb-3(b)(1), unless the authorization is terminated or revoked sooner.     Influenza A by PCR NEGATIVE NEGATIVE Final   Influenza B by PCR NEGATIVE  NEGATIVE Final    Comment: (NOTE) The Xpert Xpress SARS-CoV-2/FLU/RSV assay is intended as an aid in  the diagnosis of influenza from Nasopharyngeal swab specimens and  should not be used as a sole basis for treatment. Nasal washings and  aspirates are unacceptable for Xpert Xpress SARS-CoV-2/FLU/RSV  testing.  Fact Sheet for Patients: PinkCheek.be  Fact Sheet for Healthcare Providers: GravelBags.it  This test is not yet approved or cleared by the [place] FDA and  has been authorized for detection and/or diagnosis of SARS-CoV-2 by  FDA under an Emergency Use Authorization (EUA). This EUA will remain  in effect (meaning this test can be used) for the duration of the  Covid-19 declaration under Section 564(b)(1) of the Act, 21  U.S.C. section 360bbb-3(b)(1), unless the authorization is  terminated or revoked. Performed at [facility], [place] [address], [place], [place] [address]          Radiology Studies: CT Cervical Spine Wo Contrast  Result Date: 12/01/2019 CLINICAL DATA:  Neck trauma EXAM: CT CERVICAL SPINE WITHOUT CONTRAST TECHNIQUE: Multidetector CT imaging of the cervical spine was performed without intravenous contrast. Multiplanar CT image reconstructions were also generated. COMPARISON:  None. FINDINGS: Alignment: No subluxation.  Facet alignment within normal  limits. Skull base and vertebrae: No acute fracture. No primary bone lesion or focal pathologic process. Soft tissues and spinal canal: No prevertebral fluid or swelling. No visible canal hematoma. Disc levels:  Mild diffuse degenerative changes at multiple levels. Upper chest: Negative. Other: None IMPRESSION: Mild degenerative changes of the cervical spine. No acute osseous abnormality. Electronically Signed   By: Donavan Foil M.D.   On: 12/01/2019 19:17   MR BRAIN WO CONTRAST  Result Date: 12/01/2019 CLINICAL DATA:  54 year old male code stroke presentation. EXAM: MRI HEAD WITHOUT CONTRAST TECHNIQUE: Multiplanar, multiecho pulse sequences of the brain and surrounding structures were obtained without intravenous contrast. COMPARISON:  Plain head CT and CTA head and neck. Brain MRI 11/06/2004. FINDINGS: Brain: No restricted diffusion to suggest acute infarction. No midline shift, mass effect, evidence of mass lesion, ventriculomegaly, extra-axial collection or acute intracranial hemorrhage. Cervicomedullary junction and pituitary are within normal limits. Pearline Cables and white matter signal remains within normal limits throughout the brain. No convincing encephalomalacia. No chronic cerebral blood products. Vascular: Major intracranial vascular flow voids are stable since 2006. Skull and upper cervical spine: Negative. Sinuses/Orbits: Negative. Other: Mastoids are clear. Grossly negative visible internal auditory structures, scalp and face soft tissues. IMPRESSION: Normal noncontrast MRI appearance of the brain. Electronically Signed   By: Genevie Ann M.D.   On: 12/01/2019 21:52   DG Chest Portable 1 View  Result Date: 12/01/2019 CLINICAL DATA:  Shortness of breath EXAM: PORTABLE CHEST 1 VIEW COMPARISON:  CT 09/14/2018, radiograph 09/14/2018 FINDINGS: Chronically coarsened interstitial changes towards the lung bases with apical lucency compatible with areas of emphysematous change and basilar scarring particularly  within the lingula. These are not significantly changed from comparison radiography. No new consolidative opacity. The cardiomediastinal contours are unremarkable. Telemetry leads overlie the chest. No acute osseous or soft tissue abnormality. Degenerative changes are present in the imaged spine and shoulders. IMPRESSION: Chronically coarsened interstitial changes and apical lucency compatible with areas of basilar scarring and emphysematous features better seen on comparison CT. No acute cardiopulmonary abnormality. Electronically Signed   By: Lovena Le M.D.   On: 12/01/2019 19:34   CT HEAD CODE STROKE WO CONTRAST  Result Date: 12/01/2019 CLINICAL DATA:  Code stroke.  54 year old male  EXAM: CT HEAD WITHOUT CONTRAST TECHNIQUE: Contiguous axial images were obtained from the base of the skull through the vertex without intravenous contrast. COMPARISON:  Brain MRI 11/07/2003. FINDINGS: Brain: Cerebral volume is not significantly changed since 2006. No midline shift, ventriculomegaly, mass effect, evidence of mass lesion, intracranial hemorrhage or evidence of cortically based acute infarction. Gray-white matter differentiation is within normal limits throughout the brain. No cortical encephalomalacia identified. Vascular: Calcified atherosclerosis at the skull base. No suspicious intracranial vascular hyperdensity. Skull: Negative. Sinuses/Orbits: Visualized paranasal sinuses and mastoids are clear. Other: Visualized orbits and scalp soft tissues are within normal limits. ASPECTS [facility] Stroke Program Early CT Score) Total score (0-10 with 10 being normal): 10 IMPRESSION: 1. Normal noncontrast CT appearance of the brain. ASPECTS 10. 2. These results were communicated to Dr. Theda Sers at 7:03 pm on 12/01/2019 by text page via the [facility] messaging system. Electronically Signed   By: Genevie Ann M.D.   On: 12/01/2019 19:03   CT ANGIO HEAD CODE STROKE  Result Date: 12/01/2019 CLINICAL DATA:  54 year old male code  stroke presentation. EXAM: CT ANGIOGRAPHY HEAD AND NECK TECHNIQUE: Multidetector CT imaging of the head and neck was performed using the standard protocol during bolus administration of intravenous contrast. Multiplanar CT image reconstructions and MIPs were obtained to evaluate the vascular anatomy. Carotid stenosis measurements (when applicable) are obtained utilizing NASCET criteria, using the distal internal carotid diameter as the denominator. CONTRAST:  79mL OMNIPAQUE IOHEXOL 350 MG/ML SOLN COMPARISON:  Plain head CT 1855 hours today. FINDINGS: CTA NECK Skeleton: No acute osseous abnormality identified. Upper chest: Paraseptal emphysema in the medial left upper lobe. Atelectatic changes to the trachea. No superior mediastinal lymphadenopathy. Other neck: Reflux of venous contrast from the injected left subclavian into the paravertebral venous system greater on the left. No acute finding in the neck. Aortic arch: 4 vessel arch configuration. The left vertebral arises directly from the arch. Soft plaque in the arch and at most of the great vessel origins. Right carotid system: Soft plaque at the brachiocephalic artery origin without significant stenosis. Normal right CCA. Negative right carotid bifurcation and cervical right ICA. Left carotid system: Soft plaque at the left CCA origin without stenosis. Mild plaque at the left ICA origin and bulb without stenosis. Vertebral arteries: Mild plaque in the proximal right subclavian artery without stenosis. Normal right vertebral artery origin. Right vertebral is patent and normal to the skull base. Left vertebral arises directly from the arch with minimal plaque at its origin. Patent left vertebral to the skull base with no plaque or stenosis identified. CTA HEAD Posterior circulation: Patent distal vertebral arteries and vertebrobasilar junction. The right V4 segment is mildly dominant. There is mild irregularity and stenosis in the distal left V4 segment. Normal  right PICA origin. The left AICA may be dominant. Patent SCA and PCA origins. Posterior communicating arteries are diminutive or absent. Bilateral PCA branches are within normal limits. Anterior circulation: Both ICA siphons are patent. On the left there is mild plaque without stenosis. Minimal plaque on the right without stenosis. Patent carotid termini. Patent MCA and ACA origins. Mildly dominant left A1. Anterior communicating artery and bilateral ACA branches are within normal limits. Left MCA M1 segment and trifurcation are patent without stenosis. Left MCA branches are within normal limits. Right MCA M1 segment and bifurcation are patent without stenosis. Right MCA branches are within normal limits. Venous sinuses: Patent. Anatomic variants: Left vertebral artery arises directly from the arch and is mildly non dominant. Mildly  dominant left ACA A1. Review of the MIP images confirms the above findings IMPRESSION: 1. Negative for large vessel occlusion. 2. Generally mild atherosclerosis in the head and neck. No significant arterial stenosis identified. 3. Emphysema (ICD10-J43.9). Vascular results were communicated to Dr. Lorrin Goodell at 7:20 pmon 10/1/2021by text page via the [facility] messaging system. Electronically Signed   By: Genevie Ann M.D.   On: 12/01/2019 19:20   CT ANGIO NECK CODE STROKE  Result Date: 12/01/2019 CLINICAL DATA:  54 year old male code stroke presentation. EXAM: CT ANGIOGRAPHY HEAD AND NECK TECHNIQUE: Multidetector CT imaging of the head and neck was performed using the standard protocol during bolus administration of intravenous contrast. Multiplanar CT image reconstructions and MIPs were obtained to evaluate the vascular anatomy. Carotid stenosis measurements (when applicable) are obtained utilizing NASCET criteria, using the distal internal carotid diameter as the denominator. CONTRAST:  64mL OMNIPAQUE IOHEXOL 350 MG/ML SOLN COMPARISON:  Plain head CT 1855 hours today. FINDINGS: CTA NECK  Skeleton: No acute osseous abnormality identified. Upper chest: Paraseptal emphysema in the medial left upper lobe. Atelectatic changes to the trachea. No superior mediastinal lymphadenopathy. Other neck: Reflux of venous contrast from the injected left subclavian into the paravertebral venous system greater on the left. No acute finding in the neck. Aortic arch: 4 vessel arch configuration. The left vertebral arises directly from the arch. Soft plaque in the arch and at most of the great vessel origins. Right carotid system: Soft plaque at the brachiocephalic artery origin without significant stenosis. Normal right CCA. Negative right carotid bifurcation and cervical right ICA. Left carotid system: Soft plaque at the left CCA origin without stenosis. Mild plaque at the left ICA origin and bulb without stenosis. Vertebral arteries: Mild plaque in the proximal right subclavian artery without stenosis. Normal right vertebral artery origin. Right vertebral is patent and normal to the skull base. Left vertebral arises directly from the arch with minimal plaque at its origin. Patent left vertebral to the skull base with no plaque or stenosis identified. CTA HEAD Posterior circulation: Patent distal vertebral arteries and vertebrobasilar junction. The right V4 segment is mildly dominant. There is mild irregularity and stenosis in the distal left V4 segment. Normal right PICA origin. The left AICA may be dominant. Patent SCA and PCA origins. Posterior communicating arteries are diminutive or absent. Bilateral PCA branches are within normal limits. Anterior circulation: Both ICA siphons are patent. On the left there is mild plaque without stenosis. Minimal plaque on the right without stenosis. Patent carotid termini. Patent MCA and ACA origins. Mildly dominant left A1. Anterior communicating artery and bilateral ACA branches are within normal limits. Left MCA M1 segment and trifurcation are patent without stenosis. Left MCA  branches are within normal limits. Right MCA M1 segment and bifurcation are patent without stenosis. Right MCA branches are within normal limits. Venous sinuses: Patent. Anatomic variants: Left vertebral artery arises directly from the arch and is mildly non dominant. Mildly dominant left ACA A1. Review of the MIP images confirms the above findings IMPRESSION: 1. Negative for large vessel occlusion. 2. Generally mild atherosclerosis in the head and neck. No significant arterial stenosis identified. 3. Emphysema (ICD10-J43.9). Vascular results were communicated to Dr. Lorrin Goodell at 7:20 pmon 10/1/2021by text page via the [facility] messaging system. Electronically Signed   By: Genevie Ann M.D.   On: 12/01/2019 19:20    Scheduled Meds: . apixaban  2.5 mg Oral BID  . irbesartan  150 mg Oral Daily   Or  . hydrochlorothiazide  12.5 mg Oral Daily  . mometasone-formoterol  2 puff Inhalation BID  . pantoprazole  40 mg Oral Daily   Continuous Infusions: . sodium chloride 125 mL/hr at 12/02/19 0248     LOS: 0 days   Time spent: 50min  Jason C Preslea Rhodus, DO [facility]  If 7PM-7AM, please contact night-coverage [email or web address]  12/02/2019, 9:05 AM

## 2019-12-02 NOTE — Progress Notes (Signed)
Pt admitted from ED with c/o of S/P fall at home, pt non verbal but opens eyes to touch with occasional moans, pt settled in bed, Tele monitor put and verified on pt, safety concern initiated accordingly, v/s stable, o2 sat 94% on [facility],, will however continue to monitor. [place]-As[place], Forrester Blando Efe

## 2019-12-02 NOTE — Progress Notes (Signed)
*  PRELIMINARY RESULTS* Echocardiogram 2D Echocardiogram has been performed.  Jason Barajas 12/02/2019, 3:54 PM

## 2019-12-02 NOTE — Progress Notes (Signed)
ANTICOAGULATION CONSULT NOTE  Pharmacy Consult for Eliquis Indication: History of VTE  Vital Signs: Temp: 98.2 F (36.8 C) (10/02 1310) Temp Source: Oral (10/02 1310) BP: 118/89 (10/02 1310) Pulse Rate: 77 (10/02 1310)  Labs: Recent Labs    12/01/19 1839 12/01/19 1839 12/01/19 1840 12/01/19 1840 12/01/19 2020 12/02/19 0157 12/02/19 0218  HGB 14.7   < > 15.3   < > 14.3  --  13.9  HCT 46.2   < > 45.0  --  42.0  --  41.0  PLT 187  --   --   --   --   --   --   APTT 28  --   --   --   --   --   --   LABPROT 13.3  --   --   --   --   --   --   INR 1.1  --   --   --   --   --   --   CREATININE 0.77  --  0.80  --   --   --   --   CKTOTAL  --   --   --   --   --  71  --    < > = values in this interval not displayed.    CrCl cannot be calculated (Unknown ideal weight.).  Medical History: Past Medical History:  Diagnosis Date   Allergy    Anxiety    Clotting disorder (HCC)    Colon polyps    COPD, severe ([place])    Depression    DVT (deep venous thrombosis) (HCC)    GERD (gastroesophageal reflux disease)    H/O blood clots 04/2016   History of anal fissures    Hyperlipidemia    Hypertension    IBS (irritable bowel syndrome)    Pulmonary embolism [facility])     Assessment: 54 yo male on Eliquis prior to admission for history of a venous thromboembolism in 2018. He is on the low dose of Eliquis 2.5 mg. In 2019 Mr. Elza developed GI bleeding, requiring a colonoscopy, the dose of Eliquis was reduced after this. Anticoagulation appears to be managed by his outpatient physician, Dr. Helene Kelp.   After discussion with neurology, the decision was made to increase Mr. Jason Barajas back to 5 mg po BID and monitor closely.  Goal of Therapy:  Monitor platelets by anticoagulation protocol: Yes    Plan:  -Eliquis 5 mg po bid -Monitor s/sx bleeding   [place] Kosch, Jake Church 12/02/2019,2:39 PM

## 2019-12-03 DIAGNOSIS — F32A Depression, unspecified: Secondary | ICD-10-CM | POA: Diagnosis not present

## 2019-12-03 DIAGNOSIS — J42 Unspecified chronic bronchitis: Secondary | ICD-10-CM | POA: Diagnosis not present

## 2019-12-03 DIAGNOSIS — R401 Stupor: Secondary | ICD-10-CM | POA: Diagnosis not present

## 2019-12-03 DIAGNOSIS — R410 Disorientation, unspecified: Secondary | ICD-10-CM | POA: Diagnosis not present

## 2019-12-03 DIAGNOSIS — R55 Syncope and collapse: Secondary | ICD-10-CM

## 2019-12-03 DIAGNOSIS — W19XXXA Unspecified fall, initial encounter: Secondary | ICD-10-CM

## 2019-12-03 DIAGNOSIS — R569 Unspecified convulsions: Secondary | ICD-10-CM

## 2019-12-03 DIAGNOSIS — Z86711 Personal history of pulmonary embolism: Secondary | ICD-10-CM

## 2019-12-03 LAB — HEMOGLOBIN A1C
Hgb A1c MFr Bld: 5.5 % (ref 4.8–5.6)
Mean Plasma Glucose: 111.15 mg/dL

## 2019-12-03 LAB — LIPID PANEL
Cholesterol: 120 mg/dL (ref 0–200)
HDL: 31 mg/dL — ABNORMAL LOW (ref >40)
LDL Cholesterol: 76 mg/dL (ref 0–99)
Total CHOL/HDL Ratio: 3.9 RATIO
Triglycerides: 63 mg/dL (ref <150)
VLDL: 13 mg/dL (ref 0–40)

## 2019-12-03 MED ORDER — INFLUENZA VAC SPLIT QUAD 0.5 ML IM SUSY
0.5000 mL | PREFILLED_SYRINGE | INTRAMUSCULAR | Status: AC | PRN
  Administered 2019-12-12: 0.5 mL via INTRAMUSCULAR
  Filled 2019-12-03: qty 0.5

## 2019-12-03 MED ORDER — CLONAZEPAM 0.5 MG PO TABS
1.0000 mg | ORAL_TABLET | Freq: Two times a day (BID) | ORAL | Status: DC
  Administered 2019-12-03 – 2019-12-05 (×4): 1 mg via ORAL
  Filled 2019-12-03 (×4): qty 2

## 2019-12-03 NOTE — Progress Notes (Signed)
STROKE TEAM PROGRESS NOTE   INTERVAL HISTORY Wife at bedside.  Patient lying in bed, eyes open, slower response than yesterday, more psychomotor slowing, very distractible, decreased attention concentration, fidget.  Asterixis improved from yesterday.  However, he still orientated x3.  Patient takes heavy Xanax dose at home regularly, concerning for benzo withdrawal, put on clonazepam 1 mg twice daily.  OBJECTIVE Vitals:   12/03/19 0355 12/03/19 0802 12/03/19 0910 12/03/19 1240  BP: 133/66  (!) 145/86 136/60  Pulse: 68 78 85 82  Resp: 15 14 17 20   Temp: 98.3 F (36.8 C)  98.5 F (36.9 C) 99 F (37.2 C)  TempSrc: Axillary  Oral Oral  SpO2: 97% 99% 93% 100%    CBC:  Recent Labs  Lab 12/01/19 1839 12/01/19 1840 12/01/19 2020 12/02/19 0218  WBC 8.3  --   --   --   NEUTROABS 4.8  --   --   --   HGB 14.7   < > 14.3 13.9  HCT 46.2   < > 42.0 41.0  MCV 98.3  --   --   --   PLT 187  --   --   --    < > = values in this interval not displayed.    Basic Metabolic Panel:  Recent Labs  Lab 12/01/19 1839 12/01/19 1839 12/01/19 1840 12/01/19 1840 12/01/19 2020 12/02/19 0218  NA 136   < > 137   < > 137 136  K 3.9   < > 3.8   < > 3.8 3.8  CL 101  --  99  --   --   --   CO2 23  --   --   --   --   --   GLUCOSE 78  --  76  --   --   --   BUN 8  --  9  --   --   --   CREATININE 0.77  --  0.80  --   --   --   CALCIUM 8.5*  --   --   --   --   --    < > = values in this interval not displayed.    Lipid Panel:     Component Value Date/Time   CHOL 120 12/03/2019 0020   TRIG 63 12/03/2019 0020   HDL 31 (L) 12/03/2019 0020   CHOLHDL 3.9 12/03/2019 0020   VLDL 13 12/03/2019 0020   LDLCALC 76 12/03/2019 0020   HgbA1c:  Lab Results  Component Value Date   HGBA1C 5.5 12/03/2019   Urine Drug Screen:     Component Value Date/Time   LABOPIA NONE DETECTED 12/01/2019 1918   COCAINSCRNUR NONE DETECTED 12/01/2019 1918   LABBENZ POSITIVE (A) 12/01/2019 1918   AMPHETMU NONE  DETECTED 12/01/2019 1918   THCU NONE DETECTED 12/01/2019 1918   LABBARB NONE DETECTED 12/01/2019 1918    Alcohol Level     Component Value Date/Time   ETH <10 12/01/2019 2024    IMAGING  CT Cervical Spine Wo Contrast Result Date: 12/01/2019 CLINICAL DATA:  Neck trauma EXAM: CT CERVICAL SPINE WITHOUT CONTRAST TECHNIQUE: Multidetector CT imaging of the cervical spine was performed without intravenous contrast. Multiplanar CT image reconstructions were also generated. COMPARISON:  None. FINDINGS: Alignment: No subluxation.  Facet alignment within normal limits. Skull base and vertebrae: No acute fracture. No primary bone lesion or focal pathologic process. Soft tissues and spinal canal: No prevertebral fluid or swelling. No visible canal hematoma.  Disc levels:  Mild diffuse degenerative changes at multiple levels. Upper chest: Negative. Other: None IMPRESSION: Mild degenerative changes of the cervical spine. No acute osseous abnormality. Electronically Signed   By: Donavan Foil M.D.   On: 12/01/2019 19:17   MR BRAIN WO CONTRAST Result Date: 12/01/2019 CLINICAL DATA:  54 year old male code stroke presentation. EXAM: MRI HEAD WITHOUT CONTRAST TECHNIQUE: Multiplanar, multiecho pulse sequences of the brain and surrounding structures were obtained without intravenous contrast. COMPARISON:  Plain head CT and CTA head and neck. Brain MRI 11/06/2004. FINDINGS: Brain: No restricted diffusion to suggest acute infarction. No midline shift, mass effect, evidence of mass lesion, ventriculomegaly, extra-axial collection or acute intracranial hemorrhage. Cervicomedullary junction and pituitary are within normal limits. Pearline Cables and white matter signal remains within normal limits throughout the brain. No convincing encephalomalacia. No chronic cerebral blood products. Vascular: Major intracranial vascular flow voids are stable since 2006. Skull and upper cervical spine: Negative. Sinuses/Orbits: Negative. Other: Mastoids  are clear. Grossly negative visible internal auditory structures, scalp and face soft tissues. IMPRESSION: Normal noncontrast MRI appearance of the brain. Electronically Signed   By: Genevie Ann M.D.   On: 12/01/2019 21:52   DG Chest Portable 1 View Result Date: 12/01/2019 CLINICAL DATA:  Shortness of breath EXAM: PORTABLE CHEST 1 VIEW COMPARISON:  CT 09/14/2018, radiograph 09/14/2018 FINDINGS: Chronically coarsened interstitial changes towards the lung bases with apical lucency compatible with areas of emphysematous change and basilar scarring particularly within the lingula. These are not significantly changed from comparison radiography. No new consolidative opacity. The cardiomediastinal contours are unremarkable. Telemetry leads overlie the chest. No acute osseous or soft tissue abnormality. Degenerative changes are present in the imaged spine and shoulders. IMPRESSION: Chronically coarsened interstitial changes and apical lucency compatible with areas of basilar scarring and emphysematous features better seen on comparison CT. No acute cardiopulmonary abnormality. Electronically Signed   By: Lovena Le M.D.   On: 12/01/2019 19:34   CT HEAD CODE STROKE WO CONTRAST Result Date: 12/01/2019 CLINICAL DATA:  Code stroke.  54 year old male EXAM: CT HEAD WITHOUT CONTRAST TECHNIQUE: Contiguous axial images were obtained from the base of the skull through the vertex without intravenous contrast. COMPARISON:  Brain MRI 11/07/2003. FINDINGS: Brain: Cerebral volume is not significantly changed since 2006. No midline shift, ventriculomegaly, mass effect, evidence of mass lesion, intracranial hemorrhage or evidence of cortically based acute infarction. Gray-white matter differentiation is within normal limits throughout the brain. No cortical encephalomalacia identified. Vascular: Calcified atherosclerosis at the skull base. No suspicious intracranial vascular hyperdensity. Skull: Negative. Sinuses/Orbits: Visualized  paranasal sinuses and mastoids are clear. Other: Visualized orbits and scalp soft tissues are within normal limits. ASPECTS [facility] Stroke Program Early CT Score) Total score (0-10 with 10 being normal): 10 IMPRESSION: 1. Normal noncontrast CT appearance of the brain. ASPECTS 10. 2. These results were communicated to Dr. Theda Sers at 7:03 pm on 12/01/2019 by text page via the [facility] messaging system. Electronically Signed   By: Genevie Ann M.D.   On: 12/01/2019 19:03   CT ANGIO HEAD CODE STROKE Result Date: 12/01/2019 CLINICAL DATA:  54 year old male code stroke presentation. EXAM: CT ANGIOGRAPHY HEAD AND NECK TECHNIQUE: Multidetector CT imaging of the head and neck was performed using the standard protocol during bolus administration of intravenous contrast. Multiplanar CT image reconstructions and MIPs were obtained to evaluate the vascular anatomy. Carotid stenosis measurements (when applicable) are obtained utilizing NASCET criteria, using the distal internal carotid diameter as the denominator. CONTRAST:  86mL OMNIPAQUE IOHEXOL 350  MG/ML SOLN COMPARISON:  Plain head CT 1855 hours today. FINDINGS: CTA NECK Skeleton: No acute osseous abnormality identified. Upper chest: Paraseptal emphysema in the medial left upper lobe. Atelectatic changes to the trachea. No superior mediastinal lymphadenopathy. Other neck: Reflux of venous contrast from the injected left subclavian into the paravertebral venous system greater on the left. No acute finding in the neck. Aortic arch: 4 vessel arch configuration. The left vertebral arises directly from the arch. Soft plaque in the arch and at most of the great vessel origins. Right carotid system: Soft plaque at the brachiocephalic artery origin without significant stenosis. Normal right CCA. Negative right carotid bifurcation and cervical right ICA. Left carotid system: Soft plaque at the left CCA origin without stenosis. Mild plaque at the left ICA origin and bulb without stenosis.  Vertebral arteries: Mild plaque in the proximal right subclavian artery without stenosis. Normal right vertebral artery origin. Right vertebral is patent and normal to the skull base. Left vertebral arises directly from the arch with minimal plaque at its origin. Patent left vertebral to the skull base with no plaque or stenosis identified. CTA HEAD Posterior circulation: Patent distal vertebral arteries and vertebrobasilar junction. The right V4 segment is mildly dominant. There is mild irregularity and stenosis in the distal left V4 segment. Normal right PICA origin. The left AICA may be dominant. Patent SCA and PCA origins. Posterior communicating arteries are diminutive or absent. Bilateral PCA branches are within normal limits. Anterior circulation: Both ICA siphons are patent. On the left there is mild plaque without stenosis. Minimal plaque on the right without stenosis. Patent carotid termini. Patent MCA and ACA origins. Mildly dominant left A1. Anterior communicating artery and bilateral ACA branches are within normal limits. Left MCA M1 segment and trifurcation are patent without stenosis. Left MCA branches are within normal limits. Right MCA M1 segment and bifurcation are patent without stenosis. Right MCA branches are within normal limits. Venous sinuses: Patent. Anatomic variants: Left vertebral artery arises directly from the arch and is mildly non dominant. Mildly dominant left ACA A1. Review of the MIP images confirms the above findings IMPRESSION: 1. Negative for large vessel occlusion. 2. Generally mild atherosclerosis in the head and neck. No significant arterial stenosis identified. 3. Emphysema (ICD10-J43.9). Vascular results were communicated to Dr. Lorrin Goodell at 7:20 pmon 10/1/2021by text page via the [facility] messaging system. Electronically Signed   By: Genevie Ann M.D.   On: 12/01/2019 19:20   CT ANGIO NECK CODE STROKE Result Date: 12/01/2019 CLINICAL DATA:  54 year old male code stroke  presentation. EXAM: CT ANGIOGRAPHY HEAD AND NECK TECHNIQUE: Multidetector CT imaging of the head and neck was performed using the standard protocol during bolus administration of intravenous contrast. Multiplanar CT image reconstructions and MIPs were obtained to evaluate the vascular anatomy. Carotid stenosis measurements (when applicable) are obtained utilizing NASCET criteria, using the distal internal carotid diameter as the denominator. CONTRAST:  32mL OMNIPAQUE IOHEXOL 350 MG/ML SOLN COMPARISON:  Plain head CT 1855 hours today. FINDINGS: CTA NECK Skeleton: No acute osseous abnormality identified. Upper chest: Paraseptal emphysema in the medial left upper lobe. Atelectatic changes to the trachea. No superior mediastinal lymphadenopathy. Other neck: Reflux of venous contrast from the injected left subclavian into the paravertebral venous system greater on the left. No acute finding in the neck. Aortic arch: 4 vessel arch configuration. The left vertebral arises directly from the arch. Soft plaque in the arch and at most of the great vessel origins. Right carotid system: Soft plaque at  the brachiocephalic artery origin without significant stenosis. Normal right CCA. Negative right carotid bifurcation and cervical right ICA. Left carotid system: Soft plaque at the left CCA origin without stenosis. Mild plaque at the left ICA origin and bulb without stenosis. Vertebral arteries: Mild plaque in the proximal right subclavian artery without stenosis. Normal right vertebral artery origin. Right vertebral is patent and normal to the skull base. Left vertebral arises directly from the arch with minimal plaque at its origin. Patent left vertebral to the skull base with no plaque or stenosis identified. CTA HEAD Posterior circulation: Patent distal vertebral arteries and vertebrobasilar junction. The right V4 segment is mildly dominant. There is mild irregularity and stenosis in the distal left V4 segment. Normal right  PICA origin. The left AICA may be dominant. Patent SCA and PCA origins. Posterior communicating arteries are diminutive or absent. Bilateral PCA branches are within normal limits. Anterior circulation: Both ICA siphons are patent. On the left there is mild plaque without stenosis. Minimal plaque on the right without stenosis. Patent carotid termini. Patent MCA and ACA origins. Mildly dominant left A1. Anterior communicating artery and bilateral ACA branches are within normal limits. Left MCA M1 segment and trifurcation are patent without stenosis. Left MCA branches are within normal limits. Right MCA M1 segment and bifurcation are patent without stenosis. Right MCA branches are within normal limits. Venous sinuses: Patent. Anatomic variants: Left vertebral artery arises directly from the arch and is mildly non dominant. Mildly dominant left ACA A1. Review of the MIP images confirms the above findings IMPRESSION: 1. Negative for large vessel occlusion. 2. Generally mild atherosclerosis in the head and neck. No significant arterial stenosis identified. 3. Emphysema (ICD10-J43.9). Vascular results were communicated to Dr. Lorrin Goodell at 7:20 pmon 10/1/2021by text page via the [facility] messaging system. Electronically Signed   By: Genevie Ann M.D.   On: 12/01/2019 19:20   Transthoracic Echocardiogram  1. Left ventricular ejection fraction, by estimation, is 60 to 65%. The  left ventricle has normal function. The left ventricle has no regional  wall motion abnormalities. There is moderate left ventricular hypertrophy.  Left ventricular diastolic  parameters were normal.  2. Right ventricular systolic function is normal. The right ventricular  size is normal.  3. The mitral valve is normal in structure. No evidence of mitral valve  regurgitation. No evidence of mitral stenosis.  4. The aortic valve has an indeterminant number of cusps. There is mild  calcification of the aortic valve. There is mild thickening of  the aortic  valve. Aortic valve regurgitation is not visualized. No aortic stenosis is  present.  5. The inferior vena cava is normal in size with greater than 50%  respiratory variability, suggesting right atrial pressure of 3 mmHg.   ECG - SR rate 70 BPM. (See cardiology reading for complete details)  EEG This study is within normal limits.No seizures or epileptiform discharges were seen throughout the recording.   PHYSICAL EXAM  Temp:  [97.9 F (36.6 C)-99.3 F (37.4 C)] 99 F (37.2 C) (10/03 1240) Pulse Rate:  [63-85] 82 (10/03 1240) Resp:  [10-20] 20 (10/03 1240) BP: (109-145)/(60-86) 136/60 (10/03 1240) SpO2:  [93 %-100 %] 100 % (10/03 1240) FiO2 (%):  [28 %] 28 % (10/03 0802)  General - Well nourished, well developed, in no apparent distress, lethargic.  Ophthalmologic - fundi not visualized due to noncooperation.  Cardiovascular - Regular rhythm and rate.  Neuro - awake alert, eyes open, orientated to place, people, and time. More  prominent psychomotor slowing than yesterday but still following all simple commands, able to name and repeat. Paucity of speech with intermittent stuttering. PERRL, EOMI, visual field full. Facial symmetrical, tongue midline. BUE 4/5 but improved asterixis. BLE 3/5 proximal and 4/5 distally. FTN slow but grossly intact. Sensation symmetrical. Gait not tested.    ASSESSMENT/PLAN Mr. ALIOU MEALEY is a 54 y.o. male with history of DVT, PE, clotting disorder (Eliquis), COPD, tobacco use, HTN, and HLD brought to [facility] after he was found down and unresponsive by his family.Marland Kitchen He did not receive IV t-PA due to Eliquis therapy.   Seizure vs. Pre-syncope vs. Encephalopathy due to medication   Resultant fall at home x 2, AMS, non-focal, asterixis  CT Head - Normal noncontrast CT appearance of the brain. A  MRI head - Normal noncontrast MRI appearance of the brain.  CTA H&N - Negative for large vessel occlusion. Generally mild atherosclerosis in  the head and neck.   2D Echo - EF 60-65%  EEG - no seizure  [employer] Virus 2 - negative  LDL - 76  HgbA1c - 5.5  UDS - benzo + (on home Xanax)  VTE prophylaxis - Eliquis 2.5mg  bid  Eliquis 2.5 bid prior to admission, now on Eliquis 5 bid. Continue on discharge.   Do not feel long term AED is needed at this time.  Patient counseled to be compliant with his antithrombotic medications  Ongoing aggressive stroke risk factor management  Therapy recommendations:  pending  Disposition:  Pending  ?? Benzo withdraw  Fidget, distractible, decreased attention concentration  Takes heavy Xanax dose at home regularly for the last several years  UDS positive for benzo  Put on clonazepam 1 mg twice daily  On celexa home dose  Close monitoring  Hx of DVT/PE  04/2016 admitted in [facility] for PE.  Put on Eliquis 5 mg twice daily on discharge  Per pharmacy, in 2019, patient had rectal bleeding, Eliquis down to 2.5 twice daily  Discussed with pharmacy, put on Eliquis 5 mg twice daily  Close monitoring bleeding  Hypertension  Home BP meds: irbesartan ; HCTZ  Current BP meds: olmasartan ; HCTZ  Stable - mildly low at times . Long-term BP goal normotensive  Hyperlipidemia  Home Lipid lowering medication: Zocor 20 mg daily  LDL - 76, goal < 70  Resumed zocor 20  Continue statin at discharge  Tobacco abuse  Current smoker  Smoking cessation counseling provided  Pt is willing to quit  Other Stroke Risk Factor  Previous ETOH   Obesity, recommend weight loss, diet and exercise as appropriate   Other Active Problems  Code status - Full code   Hospital day # 1  I spent  35 minutes in total face-to-face time with the patient, more than 50% of which was spent in counseling and coordination of care, reviewing test results, images and medication, and discussing the diagnosis, treatment plan and potential prognosis. This patient's care requiresreview of multiple  databases, neurological assessment, discussion with family, other specialists and medical decision making of high complexity. I had long discussion with wife at bedside, updated pt current condition, treatment plan and potential prognosis, and answered all the questions. She expressed understanding and appreciation.   Rosalin Hawking, MD PhD Stroke Neurology 12/03/2019 6:33 PM   To contact Stroke Continuity provider, please refer to [email or web address] After hours, contact General Neurology

## 2019-12-03 NOTE — Progress Notes (Signed)
PROGRESS NOTE    LIAHM GRIVAS  KZS:[id number] DOB: 14-Jul-1965 DOA: 12/01/2019 PCP: Ronita Hipps, MD   Brief Narrative:  ZARED KNOTH is a 54 y.o. male with medical history significant of anxiety, depression, COPD, PE/DVT on Eliquis, GERD, hypertension, hyperlipidemia, IBS presenting to the ED with altered mental status. Patient is very somnolent and no history could be obtained from him. History provided by wife at bedside who states that yesterday patient had an unwitnessed fall. Then again today he had another unwitnessed fall. States this evening patient was acting normal and talking to her and another neighbor. Then all of a sudden his speech became slurred and he became unresponsive.  His eyes were still open and he did not lose consciousness.  He takes Xanax 1 mg up to 5 times a day for anxiety.  He also takes gabapentin for panic attacks.  States she fill out his pillbox and keeps the rest of the medication with her. She is not sure how much Xanax he took today. Wife states patient has depression for which he takes citalopram and is seen by a psychiatrist. States patient has not expressed any suicidal thoughts. States he has chronic shortness of breath due to COPD but has not had any fevers, cough, nausea, vomiting, or diarrhea.  He has not complained of any abdominal pain.  Wife confirms that he does not drink any alcohol.  He has been vaccinated against Covid. In DD:UKGURKYHC code stroke was activated and patient was seen by neurology.  TPA not given as he is on Eliquis.  Head CT normal.  CT angiogram head and neck negative for LVO.  Brain MRI normal. WBC 8.3, hemoglobin 14.7, hematocrit 46.2, platelet 187K.  Sodium 136, potassium 3.9, chloride 101, bicarb 23, BUN 8, creatinine 0.7, glucose 78.  LFTs normal.  INR 1.1.  UA not suggestive of infection.  UDS positive for benzodiazepines (patient takes Xanax at home).  SARS-CoV-2 PCR test negative.  Influenza panel negative.  Blood ethanol level  undetectable.  Salicylate and acetaminophen levels pending.  Chest x-ray negative for acute cardiopulmonary abnormality.  CT C-spine negative for acute osseous abnormality.  EKG without acute changes. ABG with pH 7.33, PCO2 54, PO2 60.  He was placed on BiPAP briefly which did not improve his mental status. Patient received 1 L IV fluid bolus.   Assessment & Plan:   Principal Problem:   AMS (altered mental status) Active Problems:   COPD (chronic obstructive pulmonary disease) (HCC)   Fall   History of pulmonary embolism   Depression   Acute metabolic encephalopathy, likely multifactorial, minimally improving Likely polypharmacy with subsequent withdrawal Rule out COPD exacerbation and hypercarbia -Patient off BiPAP this morning, alert oriented to person and place only, continues to be confused by simple questions or history -he does recall falling at home and going to see his neighbor but otherwise no memory prior to hospitalization. -Wife indicates she found his home Xanax bottle as well as a new bottle of carisoprodol concerning for polypharmacy(he is also on gabapentin and citalopram)-continue to hold these medications as well as other sedating medications.  This may explain his minimal improvement with flumazenil given not simply a benzodiazepine overdose. - CT head neck MRI without acute findings/process -Given atypical findings with fall, mental status changes and slurred speech with questionable weakness seizure with Todd's paralysis is certainly reasonable, EEG with neurology negative for seizure-like activity -Continue to wean oxygen and respiratory support, as above weaned off of BiPAP as of  12/03/2019 -Home med rec also includes citalopram at home but serotonin syndrome less likely given stable vital signs: no fever, diaphoresis, or agitation.  Wife confirms that patient does not drink alcohol. - Neurology following appreciate insight and recommendations - Salicylate,  acetaminophen, CK, within normal limits, ammonia minimally elevated at 46, would not explain his current mental status -Discussed with neurology, some concern for benzodiazepine withdrawal at this point given flumazenil at intake and patient's long-term dependence with Xanax reportedly taken 5 times a day scheduled over 7 years. Neurology to start low-dose Klonopin, follow closely -low threshold to discontinue if worsening mental status and suspect polypharmacy.  Unwitnessed falls:  - Unclear if these were syncopal events.  EKG not suggestive of arrhythmia.  PE less likely given no tachycardia and he is already on chronic anticoagulation with Eliquis. -Continue cardiac monitoring.    Echocardiogram unremarkable.   - PT/OT evaluation given mental status improving as above, able to follow simple commands.  Anxiety, depression -Hold home medications at this time  COPD:  -No acute exacerbation, given minimally elevated CO2 at admission at 13 and only mildly acidotic - would presume his CO2 baseline is around 50  History of PE/DVT -Continue Eliquis  DVT prophylaxis: On chronic anticoagulation with Eliquis Code Status: Full code Family Communication: Wife at bedside  Status is: Inpatient  Dispo: The patient is from: Home              Anticipated d/c is to: To be determined              Anticipated d/c date is: 72 to 96 hours pending clinical course and further findings              Patient currently not medically stable for discharge given ongoing respiratory status and severe hypoxia requiring BiPAP and high flow oxygen.  Consultants:   Neurology  Procedures:   EEG, echocardiogram  Antimicrobials:  Not indicated  Subjective: No acute issues or events overnight, patient much more awake alert oriented to person and place only this morning which is an improvement from yesterday.  Patient review of systems somewhat limited given his mental status but denies any overt nausea,  vomiting, diarrhea, constipation, headache, fevers, chills, shortness of breath, chest pain, headaches.   Objective: Vitals:   12/02/19 1841 12/02/19 2007 12/02/19 2335 12/03/19 0355  BP:   109/61 133/66  Pulse:   63 68  Resp:   14 15  Temp:  98.2 F (36.8 C) 97.9 F (36.6 C) 98.3 F (36.8 C)  TempSrc:  Oral Oral Axillary  SpO2: 97%  95% 97%    Intake/Output Summary (Last 24 hours) at 12/03/2019 0749 Last data filed at 12/02/2019 2200 Gross per 24 hour  Intake 30 ml  Output 625 ml  Net -595 ml   There were no vitals filed for this visit.  Examination:  General: Pleasantly resting in bed on BiPAP without acute distress, awake alert to person and place only HEENT:  Normocephalic atraumatic.  Sclerae nonicteric, noninjected.  Extraocular movements intact bilaterally. Neck:  Without mass or deformity.  Trachea is midline. Lungs:  Clear to auscultate bilaterally without rhonchi, wheeze, or rales. Heart:  Regular rate and rhythm.  Without murmurs, rubs, or gallops. Abdomen:  Soft, nontender, nondistended.  Without guarding or rebound. Extremities: Without cyanosis, clubbing, edema, or obvious deformity, no obvious deficits Vascular:  Dorsalis pedis and posterior tibial pulses palpable bilaterally. Skin:  Warm and dry, no erythema, no ulcerations.  Data Reviewed: I have  personally reviewed following labs and imaging studies  CBC: Recent Labs  Lab 12/01/19 1839 12/01/19 1840 12/01/19 2020 12/02/19 0218  WBC 8.3  --   --   --   NEUTROABS 4.8  --   --   --   HGB 14.7 15.3 14.3 13.9  HCT 46.2 45.0 42.0 41.0  MCV 98.3  --   --   --   PLT 187  --   --   --    Basic Metabolic Panel: Recent Labs  Lab 12/01/19 1839 12/01/19 1840 12/01/19 2020 12/02/19 0218  NA 136 137 137 136  K 3.9 3.8 3.8 3.8  CL 101 99  --   --   CO2 23  --   --   --   GLUCOSE 78 76  --   --   BUN 8 9  --   --   CREATININE 0.77 0.80  --   --   CALCIUM 8.5*  --   --   --    GFR: CrCl cannot be  calculated (Unknown ideal weight.). Liver Function Tests: Recent Labs  Lab 12/01/19 1839  AST 15  ALT 15  ALKPHOS 54  BILITOT 0.8  PROT 6.1*  ALBUMIN 3.5   No results for input(s): LIPASE, AMYLASE in the last 168 hours. Recent Labs  Lab 12/02/19 0046  AMMONIA 46*   Coagulation Profile: Recent Labs  Lab 12/01/19 1839  INR 1.1   Cardiac Enzymes: Recent Labs  Lab 12/02/19 0157  CKTOTAL 71   BNP (last 3 results) No results for input(s): PROBNP in the last 8760 hours. HbA1C: Recent Labs    12/03/19 0020  HGBA1C 5.5   CBG: Recent Labs  Lab 12/01/19 1833  GLUCAP 76   Lipid Profile: Recent Labs    12/03/19 0020  CHOL 120  HDL 31*  LDLCALC 76  TRIG 63  CHOLHDL 3.9   Thyroid Function Tests: No results for input(s): TSH, T4TOTAL, FREET4, T3FREE, THYROIDAB in the last 72 hours. Anemia Panel: No results for input(s): VITAMINB12, FOLATE, FERRITIN, TIBC, IRON, RETICCTPCT in the last 72 hours. Sepsis Labs: No results for input(s): PROCALCITON, LATICACIDVEN in the last 168 hours.  Recent Results (from the past 240 hour(s))  Respiratory Panel by RT PCR (Flu A&B, Covid) - Nasopharyngeal Swab     Status: None   Collection Time: 12/01/19  7:28 PM   Specimen: Nasopharyngeal Swab  Result Value Ref Range Status   SARS Coronavirus 2 by RT PCR NEGATIVE NEGATIVE Final    Comment: (NOTE) SARS-CoV-2 target nucleic acids are NOT DETECTED.  The SARS-CoV-2 RNA is generally detectable in upper respiratoy specimens during the acute phase of infection. The lowest concentration of SARS-CoV-2 viral copies this assay can detect is 131 copies/mL. A negative result does not preclude SARS-Cov-2 infection and should not be used as the sole basis for treatment or other patient management decisions. A negative result may occur with  improper specimen collection/handling, submission of specimen other than nasopharyngeal swab, presence of viral mutation(s) within the areas targeted by  this assay, and inadequate number of viral copies (<131 copies/mL). A negative result must be combined with clinical observations, patient history, and epidemiological information. The expected result is Negative.  Fact Sheet for Patients:  PinkCheek.be  Fact Sheet for Healthcare Providers:  GravelBags.it  This test is no t yet approved or cleared by the [place] FDA and  has been authorized for detection and/or diagnosis of SARS-CoV-2 by FDA under an Emergency Use  Authorization (EUA). This EUA will remain  in effect (meaning this test can be used) for the duration of the COVID-19 declaration under Section 564(b)(1) of the Act, 21 U.S.C. section 360bbb-3(b)(1), unless the authorization is terminated or revoked sooner.     Influenza A by PCR NEGATIVE NEGATIVE Final   Influenza B by PCR NEGATIVE NEGATIVE Final    Comment: (NOTE) The Xpert Xpress SARS-CoV-2/FLU/RSV assay is intended as an aid in  the diagnosis of influenza from Nasopharyngeal swab specimens and  should not be used as a sole basis for treatment. Nasal washings and  aspirates are unacceptable for Xpert Xpress SARS-CoV-2/FLU/RSV  testing.  Fact Sheet for Patients: PinkCheek.be  Fact Sheet for Healthcare Providers: GravelBags.it  This test is not yet approved or cleared by the [place] FDA and  has been authorized for detection and/or diagnosis of SARS-CoV-2 by  FDA under an Emergency Use Authorization (EUA). This EUA will remain  in effect (meaning this test can be used) for the duration of the  Covid-19 declaration under Section 564(b)(1) of the Act, 21  U.S.C. section 360bbb-3(b)(1), unless the authorization is  terminated or revoked. Performed at [facility], [place] [address] [place], [place] [address]          Radiology Studies: EEG  Result Date: 12/02/2019 Lora Havens, MD     12/02/2019  3:10 PM Patient Name: IZAAK SAHR MRN: [id number] Epilepsy Attending: Lora Havens Referring Physician/Provider: Dr Derrick Ravel Date: 12/02/2019 Duration: 29.29 mins Patient history: 54yo M with ams. EEG to evaluate for seizure. Level of alertness: Awake AEDs during EEG study: None Technical aspects: This EEG study was done with scalp electrodes positioned according to the 10-20 International system of electrode placement. Electrical activity was acquired at a sampling rate of 500Hz  and reviewed with a high frequency filter of 70Hz  and a low frequency filter of 1Hz . EEG data were recorded continuously and digitally stored. Description: The posterior dominant rhythm consists of 8-9 Hz activity of moderate voltage (25-35 uV) seen predominantly in posterior head regions, symmetric and reactive to eye opening and eye closing. Physiologic photic driving was not seen during photic stimulation.  Hyperventilation was not performed.   IMPRESSION: This study is within normal limits.No seizures or epileptiform discharges were seen throughout the recording. Lora Havens   CT Cervical Spine Wo Contrast  Result Date: 12/01/2019 CLINICAL DATA:  Neck trauma EXAM: CT CERVICAL SPINE WITHOUT CONTRAST TECHNIQUE: Multidetector CT imaging of the cervical spine was performed without intravenous contrast. Multiplanar CT image reconstructions were also generated. COMPARISON:  None. FINDINGS: Alignment: No subluxation.  Facet alignment within normal limits. Skull base and vertebrae: No acute fracture. No primary bone lesion or focal pathologic process. Soft tissues and spinal canal: No prevertebral fluid or swelling. No visible canal hematoma. Disc levels:  Mild diffuse degenerative changes at multiple levels. Upper chest: Negative. Other: None IMPRESSION: Mild degenerative changes of the cervical spine. No acute osseous abnormality. Electronically Signed   By: Donavan Foil M.D.   On:  12/01/2019 19:17   MR BRAIN WO CONTRAST  Result Date: 12/01/2019 CLINICAL DATA:  54 year old male code stroke presentation. EXAM: MRI HEAD WITHOUT CONTRAST TECHNIQUE: Multiplanar, multiecho pulse sequences of the brain and surrounding structures were obtained without intravenous contrast. COMPARISON:  Plain head CT and CTA head and neck. Brain MRI 11/06/2004. FINDINGS: Brain: No restricted diffusion to suggest acute infarction. No midline shift, mass effect, evidence of mass lesion, ventriculomegaly, extra-axial collection or  acute intracranial hemorrhage. Cervicomedullary junction and pituitary are within normal limits. Pearline Cables and white matter signal remains within normal limits throughout the brain. No convincing encephalomalacia. No chronic cerebral blood products. Vascular: Major intracranial vascular flow voids are stable since 2006. Skull and upper cervical spine: Negative. Sinuses/Orbits: Negative. Other: Mastoids are clear. Grossly negative visible internal auditory structures, scalp and face soft tissues. IMPRESSION: Normal noncontrast MRI appearance of the brain. Electronically Signed   By: Genevie Ann M.D.   On: 12/01/2019 21:52   DG Chest Portable 1 View  Result Date: 12/01/2019 CLINICAL DATA:  Shortness of breath EXAM: PORTABLE CHEST 1 VIEW COMPARISON:  CT 09/14/2018, radiograph 09/14/2018 FINDINGS: Chronically coarsened interstitial changes towards the lung bases with apical lucency compatible with areas of emphysematous change and basilar scarring particularly within the lingula. These are not significantly changed from comparison radiography. No new consolidative opacity. The cardiomediastinal contours are unremarkable. Telemetry leads overlie the chest. No acute osseous or soft tissue abnormality. Degenerative changes are present in the imaged spine and shoulders. IMPRESSION: Chronically coarsened interstitial changes and apical lucency compatible with areas of basilar scarring and emphysematous  features better seen on comparison CT. No acute cardiopulmonary abnormality. Electronically Signed   By: Lovena Le M.D.   On: 12/01/2019 19:34   ECHOCARDIOGRAM COMPLETE  Result Date: 12/02/2019    ECHOCARDIOGRAM REPORT   Patient Name:   NYGEL PROKOP Date of Exam: 12/02/2019 Medical Rec #:  [id number]       Height:       71.0 in Accession #:    [id number]      Weight:       233.0 lb Date of Birth:  [birth year]       BSA:          2.250 m Patient Age:    38 years        BP:           122/85 mmHg Patient Gender: M               HR:           65 bpm. Exam Location:  Inpatient Procedure: 2D Echo, Cardiac Doppler and Color Doppler Indications:    Syncope 780.2 / R55  History:        Patient has no prior history of Echocardiogram examinations.                 COPD; Risk Factors:Current Smoker, Dyslipidemia and                 Hypertension. History of pulmonary embolism.  Sonographer:    Alvino Chapel RCS Referring Phys: [id number] [place]  1. Left ventricular ejection fraction, by estimation, is 60 to 65%. The left ventricle has normal function. The left ventricle has no regional wall motion abnormalities. There is moderate left ventricular hypertrophy. Left ventricular diastolic parameters were normal.  2. Right ventricular systolic function is normal. The right ventricular size is normal.  3. The mitral valve is normal in structure. No evidence of mitral valve regurgitation. No evidence of mitral stenosis.  4. The aortic valve has an indeterminant number of cusps. There is mild calcification of the aortic valve. There is mild thickening of the aortic valve. Aortic valve regurgitation is not visualized. No aortic stenosis is present.  5. The inferior vena cava is normal in size with greater than 50% respiratory variability, suggesting right atrial pressure of 3 mmHg. FINDINGS  Left Ventricle: Left ventricular ejection  fraction, by estimation, is 60 to 65%. The left ventricle has normal function.  The left ventricle has no regional wall motion abnormalities. The left ventricular internal cavity size was normal in size. There is  moderate left ventricular hypertrophy. Left ventricular diastolic parameters were normal. Right Ventricle: The right ventricular size is normal. No increase in right ventricular wall thickness. Right ventricular systolic function is normal. Left Atrium: Left atrial size was normal in size. Right Atrium: Right atrial size was normal in size. Pericardium: There is no evidence of pericardial effusion. Mitral Valve: The mitral valve is normal in structure. No evidence of mitral valve regurgitation. No evidence of mitral valve stenosis. Tricuspid Valve: The tricuspid valve is normal in structure. Tricuspid valve regurgitation is not demonstrated. No evidence of tricuspid stenosis. Aortic Valve: The aortic valve has an indeterminant number of cusps. There is mild calcification of the aortic valve. There is mild thickening of the aortic valve. There is mild aortic valve annular calcification. Aortic valve regurgitation is not visualized. No aortic stenosis is present. Aortic valve mean gradient measures 7.0 mmHg. Aortic valve peak gradient measures 13.0 mmHg. Aortic valve area, by VTI measures 1.85 cm. Pulmonic Valve: The pulmonic valve was not well visualized. Pulmonic valve regurgitation is not visualized. No evidence of pulmonic stenosis. Aorta: The aortic root is normal in size and structure. Pulmonary Artery: Indeterminant PASP, inadequate TR jet. Venous: The inferior vena cava is normal in size with greater than 50% respiratory variability, suggesting right atrial pressure of 3 mmHg. IAS/Shunts: No atrial level shunt detected by color flow Doppler.  LEFT VENTRICLE PLAX 2D LVIDd:         4.40 cm  Diastology LVIDs:         3.00 cm  LV e' medial:    11.60 cm/s LV PW:         1.40 cm  LV E/e' medial:  7.8 LV IVS:        1.50 cm  LV e' lateral:   16.80 cm/s LVOT diam:     2.10 cm  LV E/e'  lateral: 5.4 LV SV:         74 LV SV Index:   33 LVOT Area:     3.46 cm  RIGHT VENTRICLE RV S prime:     13.30 cm/s TAPSE (M-mode): 2.2 cm LEFT ATRIUM             Index       RIGHT ATRIUM           Index LA diam:        3.20 cm 1.42 cm/m  RA Area:     19.40 cm LA Vol (A2C):   60.4 ml 26.84 ml/m RA Volume:   55.60 ml  24.71 ml/m LA Vol (A4C):   58.0 ml 25.78 ml/m LA Biplane Vol: 62.0 ml 27.56 ml/m  AORTIC VALVE AV Area (Vmax):    2.00 cm AV Area (Vmean):   2.01 cm AV Area (VTI):     1.85 cm AV Vmax:           180.00 cm/s AV Vmean:          125.000 cm/s AV VTI:            0.398 m AV Peak Grad:      13.0 mmHg AV Mean Grad:      7.0 mmHg LVOT Vmax:         104.00 cm/s LVOT Vmean:  72.400 cm/s LVOT VTI:          0.213 m LVOT/AV VTI ratio: 0.54  AORTA Ao Root diam: 3.20 cm MITRAL VALVE MV Area (PHT): 3.72 cm    SHUNTS MV Decel Time: 204 msec    Systemic VTI:  0.21 m MV E velocity: 90.80 cm/s  Systemic Diam: 2.10 cm MV A velocity: 63.00 cm/s MV E/A ratio:  1.44 Carlyle Dolly MD Electronically signed by Carlyle Dolly MD Signature Date/Time: 12/02/2019/3:59:43 PM    Final    CT HEAD CODE STROKE WO CONTRAST  Result Date: 12/01/2019 CLINICAL DATA:  Code stroke.  54 year old male EXAM: CT HEAD WITHOUT CONTRAST TECHNIQUE: Contiguous axial images were obtained from the base of the skull through the vertex without intravenous contrast. COMPARISON:  Brain MRI 11/07/2003. FINDINGS: Brain: Cerebral volume is not significantly changed since 2006. No midline shift, ventriculomegaly, mass effect, evidence of mass lesion, intracranial hemorrhage or evidence of cortically based acute infarction. Gray-white matter differentiation is within normal limits throughout the brain. No cortical encephalomalacia identified. Vascular: Calcified atherosclerosis at the skull base. No suspicious intracranial vascular hyperdensity. Skull: Negative. Sinuses/Orbits: Visualized paranasal sinuses and mastoids are clear. Other:  Visualized orbits and scalp soft tissues are within normal limits. ASPECTS [facility] Stroke Program Early CT Score) Total score (0-10 with 10 being normal): 10 IMPRESSION: 1. Normal noncontrast CT appearance of the brain. ASPECTS 10. 2. These results were communicated to Dr. Theda Sers at 7:03 pm on 12/01/2019 by text page via the [facility] messaging system. Electronically Signed   By: Genevie Ann M.D.   On: 12/01/2019 19:03   CT ANGIO HEAD CODE STROKE  Result Date: 12/01/2019 CLINICAL DATA:  54 year old male code stroke presentation. EXAM: CT ANGIOGRAPHY HEAD AND NECK TECHNIQUE: Multidetector CT imaging of the head and neck was performed using the standard protocol during bolus administration of intravenous contrast. Multiplanar CT image reconstructions and MIPs were obtained to evaluate the vascular anatomy. Carotid stenosis measurements (when applicable) are obtained utilizing NASCET criteria, using the distal internal carotid diameter as the denominator. CONTRAST:  35mL OMNIPAQUE IOHEXOL 350 MG/ML SOLN COMPARISON:  Plain head CT 1855 hours today. FINDINGS: CTA NECK Skeleton: No acute osseous abnormality identified. Upper chest: Paraseptal emphysema in the medial left upper lobe. Atelectatic changes to the trachea. No superior mediastinal lymphadenopathy. Other neck: Reflux of venous contrast from the injected left subclavian into the paravertebral venous system greater on the left. No acute finding in the neck. Aortic arch: 4 vessel arch configuration. The left vertebral arises directly from the arch. Soft plaque in the arch and at most of the great vessel origins. Right carotid system: Soft plaque at the brachiocephalic artery origin without significant stenosis. Normal right CCA. Negative right carotid bifurcation and cervical right ICA. Left carotid system: Soft plaque at the left CCA origin without stenosis. Mild plaque at the left ICA origin and bulb without stenosis. Vertebral arteries: Mild plaque in the proximal  right subclavian artery without stenosis. Normal right vertebral artery origin. Right vertebral is patent and normal to the skull base. Left vertebral arises directly from the arch with minimal plaque at its origin. Patent left vertebral to the skull base with no plaque or stenosis identified. CTA HEAD Posterior circulation: Patent distal vertebral arteries and vertebrobasilar junction. The right V4 segment is mildly dominant. There is mild irregularity and stenosis in the distal left V4 segment. Normal right PICA origin. The left AICA may be dominant. Patent SCA and PCA origins. Posterior communicating arteries are diminutive  or absent. Bilateral PCA branches are within normal limits. Anterior circulation: Both ICA siphons are patent. On the left there is mild plaque without stenosis. Minimal plaque on the right without stenosis. Patent carotid termini. Patent MCA and ACA origins. Mildly dominant left A1. Anterior communicating artery and bilateral ACA branches are within normal limits. Left MCA M1 segment and trifurcation are patent without stenosis. Left MCA branches are within normal limits. Right MCA M1 segment and bifurcation are patent without stenosis. Right MCA branches are within normal limits. Venous sinuses: Patent. Anatomic variants: Left vertebral artery arises directly from the arch and is mildly non dominant. Mildly dominant left ACA A1. Review of the MIP images confirms the above findings IMPRESSION: 1. Negative for large vessel occlusion. 2. Generally mild atherosclerosis in the head and neck. No significant arterial stenosis identified. 3. Emphysema (ICD10-J43.9). Vascular results were communicated to Dr. Lorrin Goodell at 7:20 pmon 10/1/2021by text page via the [facility] messaging system. Electronically Signed   By: Genevie Ann M.D.   On: 12/01/2019 19:20   CT ANGIO NECK CODE STROKE  Result Date: 12/01/2019 CLINICAL DATA:  54 year old male code stroke presentation. EXAM: CT ANGIOGRAPHY HEAD AND NECK  TECHNIQUE: Multidetector CT imaging of the head and neck was performed using the standard protocol during bolus administration of intravenous contrast. Multiplanar CT image reconstructions and MIPs were obtained to evaluate the vascular anatomy. Carotid stenosis measurements (when applicable) are obtained utilizing NASCET criteria, using the distal internal carotid diameter as the denominator. CONTRAST:  41mL OMNIPAQUE IOHEXOL 350 MG/ML SOLN COMPARISON:  Plain head CT 1855 hours today. FINDINGS: CTA NECK Skeleton: No acute osseous abnormality identified. Upper chest: Paraseptal emphysema in the medial left upper lobe. Atelectatic changes to the trachea. No superior mediastinal lymphadenopathy. Other neck: Reflux of venous contrast from the injected left subclavian into the paravertebral venous system greater on the left. No acute finding in the neck. Aortic arch: 4 vessel arch configuration. The left vertebral arises directly from the arch. Soft plaque in the arch and at most of the great vessel origins. Right carotid system: Soft plaque at the brachiocephalic artery origin without significant stenosis. Normal right CCA. Negative right carotid bifurcation and cervical right ICA. Left carotid system: Soft plaque at the left CCA origin without stenosis. Mild plaque at the left ICA origin and bulb without stenosis. Vertebral arteries: Mild plaque in the proximal right subclavian artery without stenosis. Normal right vertebral artery origin. Right vertebral is patent and normal to the skull base. Left vertebral arises directly from the arch with minimal plaque at its origin. Patent left vertebral to the skull base with no plaque or stenosis identified. CTA HEAD Posterior circulation: Patent distal vertebral arteries and vertebrobasilar junction. The right V4 segment is mildly dominant. There is mild irregularity and stenosis in the distal left V4 segment. Normal right PICA origin. The left AICA may be dominant. Patent  SCA and PCA origins. Posterior communicating arteries are diminutive or absent. Bilateral PCA branches are within normal limits. Anterior circulation: Both ICA siphons are patent. On the left there is mild plaque without stenosis. Minimal plaque on the right without stenosis. Patent carotid termini. Patent MCA and ACA origins. Mildly dominant left A1. Anterior communicating artery and bilateral ACA branches are within normal limits. Left MCA M1 segment and trifurcation are patent without stenosis. Left MCA branches are within normal limits. Right MCA M1 segment and bifurcation are patent without stenosis. Right MCA branches are within normal limits. Venous sinuses: Patent. Anatomic variants: Left vertebral  artery arises directly from the arch and is mildly non dominant. Mildly dominant left ACA A1. Review of the MIP images confirms the above findings IMPRESSION: 1. Negative for large vessel occlusion. 2. Generally mild atherosclerosis in the head and neck. No significant arterial stenosis identified. 3. Emphysema (ICD10-J43.9). Vascular results were communicated to Dr. Lorrin Goodell at 7:20 pmon 10/1/2021by text page via the [facility] messaging system. Electronically Signed   By: Genevie Ann M.D.   On: 12/01/2019 19:20    Scheduled Meds: . apixaban  5 mg Oral BID  . citalopram  20 mg Oral Daily   And  . citalopram  40 mg Oral QHS  . irbesartan  150 mg Oral Daily   Or  . hydrochlorothiazide  12.5 mg Oral Daily  . mometasone-formoterol  2 puff Inhalation BID  . pantoprazole  40 mg Oral Daily  . simvastatin  20 mg Oral q1800   Continuous Infusions:    LOS: 1 day   Time spent: 57min  Erman C Minsa Weddington, DO [facility]  If 7PM-7AM, please contact night-coverage [email or web address]  12/03/2019, 7:49 AM

## 2019-12-04 ENCOUNTER — Inpatient Hospital Stay (HOSPITAL_COMMUNITY): Payer: Medicare Other

## 2019-12-04 DIAGNOSIS — J42 Unspecified chronic bronchitis: Secondary | ICD-10-CM | POA: Diagnosis not present

## 2019-12-04 DIAGNOSIS — W19XXXA Unspecified fall, initial encounter: Secondary | ICD-10-CM | POA: Diagnosis not present

## 2019-12-04 DIAGNOSIS — F32A Depression, unspecified: Secondary | ICD-10-CM | POA: Diagnosis not present

## 2019-12-04 DIAGNOSIS — R410 Disorientation, unspecified: Secondary | ICD-10-CM | POA: Diagnosis not present

## 2019-12-04 DIAGNOSIS — R401 Stupor: Secondary | ICD-10-CM | POA: Diagnosis not present

## 2019-12-04 LAB — CBC
HCT: 46.6 % (ref 39.0–52.0)
Hemoglobin: 15.2 g/dL (ref 13.0–17.0)
MCH: 31 pg (ref 26.0–34.0)
MCHC: 32.6 g/dL (ref 30.0–36.0)
MCV: 94.9 fL (ref 80.0–100.0)
Platelets: 210 10*3/uL (ref 150–400)
RBC: 4.91 MIL/uL (ref 4.22–5.81)
RDW: 13.1 % (ref 11.5–15.5)
WBC: 11.8 10*3/uL — ABNORMAL HIGH (ref 4.0–10.5)
nRBC: 0 % (ref 0.0–0.2)

## 2019-12-04 LAB — COMPREHENSIVE METABOLIC PANEL
ALT: 16 U/L (ref 0–44)
AST: 20 U/L (ref 15–41)
Albumin: 3.5 g/dL (ref 3.5–5.0)
Alkaline Phosphatase: 70 U/L (ref 38–126)
Anion gap: 15 (ref 5–15)
BUN: 10 mg/dL (ref 6–20)
CO2: 20 mmol/L — ABNORMAL LOW (ref 22–32)
Calcium: 8.9 mg/dL (ref 8.9–10.3)
Chloride: 102 mmol/L (ref 98–111)
Creatinine, Ser: 0.85 mg/dL (ref 0.61–1.24)
GFR calc Af Amer: >60 mL/min (ref >60)
GFR calc non Af Amer: >60 mL/min (ref >60)
Glucose, Bld: 96 mg/dL (ref 70–99)
Potassium: 4 mmol/L (ref 3.5–5.1)
Sodium: 137 mmol/L (ref 135–145)
Total Bilirubin: 1.4 mg/dL — ABNORMAL HIGH (ref 0.3–1.2)
Total Protein: 6.8 g/dL (ref 6.5–8.1)

## 2019-12-04 LAB — BLOOD GAS, ARTERIAL
Acid-Base Excess: 0.8 mmol/L (ref 0.0–2.0)
Bicarbonate: 24.9 mmol/L (ref 20.0–28.0)
Drawn by: 54887
FIO2: 32
O2 Saturation: 97.3 %
Patient temperature: 37
pCO2 arterial: 40 mmHg (ref 32.0–48.0)
pH, Arterial: 7.41 (ref 7.350–7.450)
pO2, Arterial: 92.8 mmHg (ref 83.0–108.0)

## 2019-12-04 LAB — HEPARIN LEVEL (UNFRACTIONATED): Heparin Unfractionated: 0.92 IU/mL — ABNORMAL HIGH (ref 0.30–0.70)

## 2019-12-04 LAB — APTT: aPTT: 55 seconds — ABNORMAL HIGH (ref 24–36)

## 2019-12-04 LAB — AMMONIA: Ammonia: 26 umol/L (ref 9–35)

## 2019-12-04 LAB — GLUCOSE, CAPILLARY: Glucose-Capillary: 88 mg/dL (ref 70–99)

## 2019-12-04 MED ORDER — GADOBUTROL 1 MMOL/ML IV SOLN
10.0000 mL | Freq: Once | INTRAVENOUS | Status: AC | PRN
  Administered 2019-12-04: 10 mL via INTRAVENOUS

## 2019-12-04 MED ORDER — HEPARIN (PORCINE) 25000 UT/250ML-% IV SOLN
1800.0000 [IU]/h | INTRAVENOUS | Status: AC
  Administered 2019-12-04: 1500 [IU]/h via INTRAVENOUS
  Administered 2019-12-05: 1800 [IU]/h via INTRAVENOUS
  Filled 2019-12-04 (×2): qty 250

## 2019-12-04 NOTE — Progress Notes (Signed)
Spoke to RN to coordinate what time pt is going to MRI. She has dept number and will call when pt is back from MRI

## 2019-12-04 NOTE — Progress Notes (Signed)
PT Cancellation Note  Patient Details Name: Jason Barajas MRN: [id number] DOB: 1966/03/02   Cancelled Treatment:    Reason Eval/Treat Not Completed: Patient at procedure or test/unavailable. Pt getting MRI, off unit at this time. Will follow-up when available.   Moishe Spice, PT, DPT Acute Rehabilitation Services  Pager: [phone] Office: [place] 12/04/2019, 9:32 AM

## 2019-12-04 NOTE — Progress Notes (Signed)
ANTICOAGULATION CONSULT NOTE - Initial Consult  Pharmacy Consult for Apixaban to Heparin IV Indication: h/o DVT, PE and clotting disorder  Allergies  Allergen Reactions  . Penicillins Other (See Comments)    Unknown childhood reaction  . Codeine Rash    Patient Measurements:   Heparin Dosing Weight: 111kg   Vital Signs: Temp: 100 F (37.8 C) (10/04 0804) Temp Source: Oral (10/04 0804) BP: 160/92 (10/04 0804) Pulse Rate: 106 (10/04 0804)  Labs: Recent Labs    12/01/19 1839 12/01/19 1839 12/01/19 1840 12/01/19 1840 12/01/19 2020 12/02/19 0157 12/02/19 0218  HGB 14.7   < > 15.3   < > 14.3  --  13.9  HCT 46.2   < > 45.0  --  42.0  --  41.0  PLT 187  --   --   --   --   --   --   APTT 28  --   --   --   --   --   --   LABPROT 13.3  --   --   --   --   --   --   INR 1.1  --   --   --   --   --   --   CREATININE 0.77  --  0.80  --   --   --   --   CKTOTAL  --   --   --   --   --  71  --    < > = values in this interval not displayed.    CrCl cannot be calculated (Unknown ideal weight.).   Medical History: Past Medical History:  Diagnosis Date  . Allergy   . Anxiety   . Clotting disorder ([place])   . Colon polyps   . COPD, severe ([place])   . Depression   . DVT (deep venous thrombosis) ([place])   . GERD (gastroesophageal reflux disease)   . H/O blood clots 04/2016  . History of anal fissures   . Hyperlipidemia   . Hypertension   . IBS (irritable bowel syndrome)   . Pulmonary embolism [facility])    Assessment: 54 yo male brought to [facility] after he was found down and unresponsive by his family. Patient on Apixaban 2.5mg  PO BID at home which was increased to 5mg  BID. Last dose 10/3 2212.  Neurology now wishes patient to be transitioned to IV heparin in anticipation of possible LP. Heparin will be delayed until after MRI to rule out head bleed.   Will need to use APTT until effect of DOAC on heparin level wears off. No acute hemorrhage noted on MRI, therefore will proceed  with heparin drip.   Goal of Therapy:  Heparin level 0.3-0.7 units/ml Monitor platelets by anticoagulation protocol: Yes  APTT 66-102   Plan:  Heparin 1500 units/hr, no bolus APTT and Heparin level in 6 hours Daily APTT, heparin level, and CBC Monitor for bleeding.   Enyah Moman A. Levada Dy, PharmD, BCPS, FNKF Clinical Pharmacist [place] Please utilize Amion for appropriate phone number to reach the unit pharmacist ([place])   12/04/2019,9:08 AM

## 2019-12-04 NOTE — Evaluation (Signed)
Occupational Therapy Evaluation Patient Details Name: Jason Barajas MRN: [id number] DOB: [birth year] Today's Date: 12/04/2019    History of Present Illness Pt is a 54 y.o. male with history of DVT, PE, clotting disorder (Eliquis), COPD, tobacco use, HTN, and HLD brought to [facility] after he was found down and unresponsive by his family. CT/MRI negative, concern for benzo withdaw.   Clinical Impression   PT admitted with benzo withdrawal. Pt currently with functional limitiations due to the deficits listed below (see OT problem list). Pt currently with limited engagement but motivated to brush teeth. Pt does complete task with simple command. Pt does not follow any other attempted commands. Wife reports pt has said a few words to her. Pt only responses "yes " to therapist. Pt with R upward gaze preference noted Pt will benefit from skilled OT to increase their independence and safety with adls and balance to allow discharge CIR.     Follow Up Recommendations  CIR    Equipment Recommendations  Other (comment) (TBA)    Recommendations for Other Services Rehab consult     Precautions / Restrictions Precautions Precautions: Fall Precaution Comments: seizure      Mobility Bed Mobility               General bed mobility comments: defer to next session due to EEG  Transfers                 General transfer comment: defer to next session due to EEG    Balance                                           ADL either performed or assessed with clinical judgement   ADL Overall ADL's : Needs assistance/impaired     Grooming: Oral care;Moderate assistance;Bed level Grooming Details (indicate cue type and reason): backward chaining and only handing tooth bruth to patient                               General ADL Comments: pt on continous EEG and will require total Care for all bathing/ dressing at this time. pt does not follow commands  consistently     Vision Baseline Vision/History: Wears glasses Wears Glasses: Reading only Additional Comments: noted to have R upward gaze     Perception     Praxis      Pertinent Vitals/Pain Pain Assessment: Faces Pain Score: 0-No pain     Hand Dominance Right   Extremity/Trunk Assessment Upper Extremity Assessment Upper Extremity Assessment: Generalized weakness;Difficult to assess due to impaired cognition   Lower Extremity Assessment Lower Extremity Assessment: Defer to PT evaluation;Difficult to assess due to impaired cognition       Communication Communication Communication: Expressive difficulties;Receptive difficulties (does respond yes)   Cognition Arousal/Alertness: Awake/alert Behavior During Therapy: Flat affect Overall Cognitive Status: Impaired/Different from baseline                                 General Comments: does not response to therapist consistently. pt does track thearpist to name call. pt consistently tracks photo of [name]. Pt wants to brush teeth and completes task with 1 step comamnds   General Comments       Exercises  Shoulder Instructions      Home Living Family/patient expects to be discharged to:: Private residence   Available Help at Discharge: Family Type of Home: House Home Access: Stairs to enter [employer] of Steps: 6 Entrance Stairs-Rails: Left Home Layout: One level;Laundry or work area in basement     [employer]: Occupational psychologist: [place]: None   Additional Comments: wants to see his granddaughter Jason Barajas whose 94 mo old per wife/ enjoys fishing and racing      Prior Functioning/Environment Level of Independence: Independent        Comments: on disability prior to event        OT Problem List: Decreased strength;Decreased activity tolerance;Impaired balance (sitting and/or standing);Decreased coordination;Impaired  vision/perception;Decreased cognition;Decreased safety awareness;Decreased knowledge of use of DME or AE;Decreased knowledge of precautions;Cardiopulmonary status limiting activity;Obesity      OT Treatment/Interventions: Self-care/ADL training;Therapeutic exercise;Neuromuscular education;Energy conservation;DME and/or AE instruction;Manual therapy;Modalities;Therapeutic activities;Cognitive remediation/compensation;Visual/perceptual remediation/compensation;Patient/family education;Balance training    OT Goals(Current goals can be found in the care plan section) Acute Rehab OT Goals Patient Stated Goal: family wants him to be able to come home OT Goal Formulation: With family Time For Goal Achievement: 12/18/19 Potential to Achieve Goals: Good  OT Frequency: Min 2X/week   Barriers to D/C:            Co-evaluation              AM-PAC OT "6 Clicks" Daily Activity     Outcome Measure Help from another person eating meals?: A Lot Help from another person taking care of personal grooming?: A Lot Help from another person toileting, which includes using toliet, bedpan, or urinal?: A Lot Help from another person bathing (including washing, rinsing, drying)?: A Lot Help from another person to put on and taking off regular upper body clothing?: A Lot Help from another person to put on and taking off regular lower body clothing?: A Lot 6 Click Score: 12   End of Session Nurse Communication: Mobility status;Precautions  Activity Tolerance: Patient tolerated treatment well Patient left: in bed;with call bell/phone within reach;with nursing/sitter in room;with family/visitor present  OT Visit Diagnosis: Unsteadiness on feet (R26.81);Muscle weakness (generalized) (M62.81);Other symptoms and signs involving the nervous system (R29.898)                Time: 8242-3536 OT Time Calculation (min): 13 min Charges:  OT General Charges $OT Visit: 1 Visit OT Evaluation $OT Eval Moderate  Complexity: 1 Mod   [place], OTR/L  Acute Rehabilitation Services Pager: [phone] Office: [phone] .   Jeri Modena 12/04/2019, 1:36 PM

## 2019-12-04 NOTE — Progress Notes (Signed)
ABG sample collected and sent to Lab. Lab was called and notified.  

## 2019-12-04 NOTE — Significant Event (Signed)
Rapid Response Event Note   Reason for Call :  Altered mental status, not speaking this am  Initial Focused Assessment:  He is lying in bed in no distress, he is warm to the touch Lung sounds clear, heart tones regular He is able to move all extremeties, no drift. He is mute and not following commands He has a rthymic blinking of his eyes Pupils are equal and reactive  BP 160/92  SR 112  RR 23  O2 sat 93% on 2L [place] Oral temp 100 CBG 88  Spoke with night shift RN, he did not speak to her overnight, he would nod his head in response to questions. Per bedside staff the last time he spoke to staff was yesterday about 6pm  Interventions:    Plan of Care:  MRI EEG  Event Summary:   MD Notified:  Dr Avon Gully and Dr Erlinda Hong Call Time: 0745 Arrival Time: 6195 End Time:  0932  Raliegh Ip, RN

## 2019-12-04 NOTE — Progress Notes (Signed)
PROGRESS NOTE    Jason Barajas  WGY:[id number] DOB: 1965/07/10 DOA: 12/01/2019 PCP: Ronita Hipps, MD   Brief Narrative:  Jason Barajas is a 54 y.o. male with medical history significant of anxiety, depression, COPD, PE/DVT on Eliquis, GERD, hypertension, hyperlipidemia, IBS presenting to the ED with altered mental status. Patient is very somnolent and no history could be obtained from him. History provided by wife at bedside who states that yesterday patient had an unwitnessed fall. Then again today he had another unwitnessed fall. States this evening patient was acting normal and talking to her and another neighbor. Then all of a sudden his speech became slurred and he became unresponsive.  His eyes were still open and he did not lose consciousness.  He takes Xanax 1 mg up to 5 times a day for anxiety.  He also takes gabapentin for panic attacks.  States she fill out his pillbox and keeps the rest of the medication with her. She is not sure how much Xanax he took today. Wife states patient has depression for which he takes citalopram and is seen by a psychiatrist. States patient has not expressed any suicidal thoughts. States he has chronic shortness of breath due to COPD but has not had any fevers, cough, nausea, vomiting, or diarrhea.  He has not complained of any abdominal pain.  Wife confirms that he does not drink any alcohol.  He has been vaccinated against Covid. In ED: Initially code stroke was activated and patient was seen by neurology.  TPA not given as he is on Eliquis.  Head CT normal.  CT angiogram head and neck negative for LVO.  Brain MRI normal. WBC 8.3, hemoglobin 14.7, hematocrit 46.2, platelet 187K.  Sodium 136, potassium 3.9, chloride 101, bicarb 23, BUN 8, creatinine 0.7, glucose 78.  LFTs normal.  INR 1.1.  UA not suggestive of infection.  UDS positive for benzodiazepines (patient takes Xanax at home).  SARS-CoV-2 PCR test negative.  Influenza panel negative.  Blood ethanol  level undetectable.  Salicylate and acetaminophen levels pending.  Chest x-ray negative for acute cardiopulmonary abnormality.  CT C-spine negative for acute osseous abnormality.  EKG without acute changes. ABG with pH 7.33, PCO2 54, PO2 60.  He was placed on BiPAP briefly which did not improve his mental status. Patient received 1 L IV fluid bolus.   Assessment & Plan:   Principal Problem:   AMS (altered mental status) Active Problems:   COPD (chronic obstructive pulmonary disease) (HCC)   Fall   History of pulmonary embolism   Depression   Acute metabolic encephalopathy, likely multifactorial, somewhat worsening overnight Likely polypharmacy with subsequent withdrawal Rule out acute intracranial process -Worsening mental status over the past 12 hours, essentially nonverbal and poorly interactive at this point but maintaining airway protection  -Unclear if this is toxic encephalopathy from overdose or withdrawal given patient has longstanding history of benzos and had flumazenil at intake with home medication cessation.   -Neurology continues to follow, repeat MRI, EEG pending, possible need for LP subsequently, transition from Eliquis to heparin drip patient of possible LP  -Wife indicates he is chronically on Xanax 5 times daily and was recently prescribed Risperdal, unclear if he took these together, this may explain his minimal improvement with flumazenil given not simply a benzodiazepine overdose. - CT head neck MRI without acute findings/process at intake, repeat MRI unremarkable -Initial EEG negative for seizure-like activity, repeat EEG pending -Currently on low-dose scheduled Klonopin and as needed Ativan given  discussion with neurology concern for withdrawal.  Unwitnessed falls:  - Unclear if these were syncopal events.  EKG not suggestive of arrhythmia.  PE less likely given no tachycardia and he is already on chronic anticoagulation with Eliquis. -Continue cardiac monitoring.     Echocardiogram unremarkable.   - PT/OT evaluation given mental status improving as above, able to follow simple commands.  Anxiety, depression -Hold home medications at this time  COPD:  -No acute exacerbation, given minimally elevated CO2 at admission at 65 and only mildly acidotic - would presume his CO2 baseline is around 50  History of PE/DVT -Continue Eliquis  DVT prophylaxis:  Transition from Eliquis to heparin drip as above on 12/04/2019 Code Status: Full code Family Communication: Wife at bedside  Status is: Inpatient  Dispo: The patient is from: Home              Anticipated d/c is to: To be determined              Anticipated d/c date is: 72 to 96 hours pending clinical course and further findings              Patient currently not medically stable for discharge given worsening mental status, respiratory status and severe hypoxia  Consultants:   Neurology  Procedures:   EEG x2, echocardiogram  Antimicrobials:  Not indicated  Subjective: No acute issues or events overnight, patient much more awake alert oriented to person and place only this morning which is an improvement from yesterday.  Patient review of systems somewhat limited given his mental status but denies any overt nausea, vomiting, diarrhea, constipation, headache, fevers, chills, shortness of breath, chest pain, headaches.   Objective: Vitals:   12/03/19 1955 12/03/19 2336 12/04/19 0355 12/04/19 0745  BP: (!) 144/86 (!) 136/95 (!) 154/93 (!) 161/94  Pulse: 85  96   Resp: 19  20 20   Temp: 98.7 F (37.1 C) 99.5 F (37.5 C) 98.9 F (37.2 C)   TempSrc: Oral Axillary Axillary   SpO2: 93%  94% 93%    Intake/Output Summary (Last 24 hours) at 12/04/2019 0754 Last data filed at 12/03/2019 2200 Gross per 24 hour  Intake 30 ml  Output --  Net 30 ml   There were no vitals filed for this visit.  Examination:  General: Pleasantly resting in bed on BiPAP without acute distress, awake alert to  person and place only HEENT:  Normocephalic atraumatic.  Sclerae nonicteric, noninjected.  Extraocular movements intact bilaterally. Neck:  Without mass or deformity.  Trachea is midline. Lungs:  Clear to auscultate bilaterally without rhonchi, wheeze, or rales. Heart:  Regular rate and rhythm.  Without murmurs, rubs, or gallops. Abdomen:  Soft, nontender, nondistended.  Without guarding or rebound. Extremities: Without cyanosis, clubbing, edema, or obvious deformity, no obvious deficits Vascular:  Dorsalis pedis and posterior tibial pulses palpable bilaterally. Skin:  Warm and dry, no erythema, no ulcerations.  Data Reviewed: I have personally reviewed following labs and imaging studies  CBC: Recent Labs  Lab 12/01/19 1839 12/01/19 1840 12/01/19 2020 12/02/19 0218  WBC 8.3  --   --   --   NEUTROABS 4.8  --   --   --   HGB 14.7 15.3 14.3 13.9  HCT 46.2 45.0 42.0 41.0  MCV 98.3  --   --   --   PLT 187  --   --   --    Basic Metabolic Panel: Recent Labs  Lab 12/01/19 1839 12/01/19 1840 12/01/19  2020 12/02/19 0218  NA 136 137 137 136  K 3.9 3.8 3.8 3.8  CL 101 99  --   --   CO2 23  --   --   --   GLUCOSE 78 76  --   --   BUN 8 9  --   --   CREATININE 0.77 0.80  --   --   CALCIUM 8.5*  --   --   --    GFR: CrCl cannot be calculated (Unknown ideal weight.). Liver Function Tests: Recent Labs  Lab 12/01/19 1839  AST 15  ALT 15  ALKPHOS 54  BILITOT 0.8  PROT 6.1*  ALBUMIN 3.5   No results for input(s): LIPASE, AMYLASE in the last 168 hours. Recent Labs  Lab 12/02/19 0046  AMMONIA 46*   Coagulation Profile: Recent Labs  Lab 12/01/19 1839  INR 1.1   Cardiac Enzymes: Recent Labs  Lab 12/02/19 0157  CKTOTAL 71   BNP (last 3 results) No results for input(s): PROBNP in the last 8760 hours. HbA1C: Recent Labs    12/03/19 0020  HGBA1C 5.5   CBG: Recent Labs  Lab 12/01/19 1833  GLUCAP 76   Lipid Profile: Recent Labs    12/03/19 0020  CHOL 120   HDL 31*  LDLCALC 76  TRIG 63  CHOLHDL 3.9   Thyroid Function Tests: No results for input(s): TSH, T4TOTAL, FREET4, T3FREE, THYROIDAB in the last 72 hours. Anemia Panel: No results for input(s): VITAMINB12, FOLATE, FERRITIN, TIBC, IRON, RETICCTPCT in the last 72 hours. Sepsis Labs: No results for input(s): PROCALCITON, LATICACIDVEN in the last 168 hours.  Recent Results (from the past 240 hour(s))  Respiratory Panel by RT PCR (Flu A&B, Covid) - Nasopharyngeal Swab     Status: None   Collection Time: 12/01/19  7:28 PM   Specimen: Nasopharyngeal Swab  Result Value Ref Range Status   SARS Coronavirus 2 by RT PCR NEGATIVE NEGATIVE Final    Comment: (NOTE) SARS-CoV-2 target nucleic acids are NOT DETECTED.  The SARS-CoV-2 RNA is generally detectable in upper respiratoy specimens during the acute phase of infection. The lowest concentration of SARS-CoV-2 viral copies this assay can detect is 131 copies/mL. A negative result does not preclude SARS-Cov-2 infection and should not be used as the sole basis for treatment or other patient management decisions. A negative result may occur with  improper specimen collection/handling, submission of specimen other than nasopharyngeal swab, presence of viral mutation(s) within the areas targeted by this assay, and inadequate number of viral copies (<131 copies/mL). A negative result must be combined with clinical observations, patient history, and epidemiological information. The expected result is Negative.  Fact Sheet for Patients:  PinkCheek.be  Fact Sheet for Healthcare Providers:  GravelBags.it  This test is no t yet approved or cleared by the [place] FDA and  has been authorized for detection and/or diagnosis of SARS-CoV-2 by FDA under an Emergency Use Authorization (EUA). This EUA will remain  in effect (meaning this test can be used) for the duration of the COVID-19  declaration under Section 564(b)(1) of the Act, 21 U.S.C. section 360bbb-3(b)(1), unless the authorization is terminated or revoked sooner.     Influenza A by PCR NEGATIVE NEGATIVE Final   Influenza B by PCR NEGATIVE NEGATIVE Final    Comment: (NOTE) The Xpert Xpress SARS-CoV-2/FLU/RSV assay is intended as an aid in  the diagnosis of influenza from Nasopharyngeal swab specimens and  should not be used as a sole basis  for treatment. Nasal washings and  aspirates are unacceptable for Xpert Xpress SARS-CoV-2/FLU/RSV  testing.  Fact Sheet for Patients: PinkCheek.be  Fact Sheet for Healthcare Providers: GravelBags.it  This test is not yet approved or cleared by the [place] FDA and  has been authorized for detection and/or diagnosis of SARS-CoV-2 by  FDA under an Emergency Use Authorization (EUA). This EUA will remain  in effect (meaning this test can be used) for the duration of the  Covid-19 declaration under Section 564(b)(1) of the Act, 21  U.S.C. section 360bbb-3(b)(1), unless the authorization is  terminated or revoked. Performed at [facility], [place] [address], [place], [place] [address]          Radiology Studies: EEG  Result Date: 12/02/2019 Lora Havens, MD     12/02/2019  3:10 PM Patient Name: CEDERICK BROADNAX MRN: [id number] Epilepsy Attending: Lora Havens Referring Physician/Provider: Dr Derrick Ravel Date: 12/02/2019 Duration: 29.29 mins Patient history: 54yo M with ams. EEG to evaluate for seizure. Level of alertness: Awake AEDs during EEG study: None Technical aspects: This EEG study was done with scalp electrodes positioned according to the 10-20 International system of electrode placement. Electrical activity was acquired at a sampling rate of 500Hz  and reviewed with a high frequency filter of 70Hz  and a low frequency filter of 1Hz . EEG data were recorded continuously and digitally  stored. Description: The posterior dominant rhythm consists of 8-9 Hz activity of moderate voltage (25-35 uV) seen predominantly in posterior head regions, symmetric and reactive to eye opening and eye closing. Physiologic photic driving was not seen during photic stimulation.  Hyperventilation was not performed.   IMPRESSION: This study is within normal limits.No seizures or epileptiform discharges were seen throughout the recording. Lora Havens   ECHOCARDIOGRAM COMPLETE  Result Date: 12/02/2019    ECHOCARDIOGRAM REPORT   Patient Name:   DEJION GRILLO Date of Exam: 12/02/2019 Medical Rec #:  [id number]       Height:       71.0 in Accession #:    [id number]      Weight:       233.0 lb Date of Birth:  1965-05-08       BSA:          2.250 m Patient Age:    69 years        BP:           122/85 mmHg Patient Gender: M               HR:           65 bpm. Exam Location:  Inpatient Procedure: 2D Echo, Cardiac Doppler and Color Doppler Indications:    Syncope 780.2 / R55  History:        Patient has no prior history of Echocardiogram examinations.                 COPD; Risk Factors:Current Smoker, Dyslipidemia and                 Hypertension. History of pulmonary embolism.  Sonographer:    Alvino Chapel RCS Referring Phys: [id number] [place]  1. Left ventricular ejection fraction, by estimation, is 60 to 65%. The left ventricle has normal function. The left ventricle has no regional wall motion abnormalities. There is moderate left ventricular hypertrophy. Left ventricular diastolic parameters were normal.  2. Right ventricular systolic function is normal. The right ventricular size is normal.  3.  The mitral valve is normal in structure. No evidence of mitral valve regurgitation. No evidence of mitral stenosis.  4. The aortic valve has an indeterminant number of cusps. There is mild calcification of the aortic valve. There is mild thickening of the aortic valve. Aortic valve regurgitation is not  visualized. No aortic stenosis is present.  5. The inferior vena cava is normal in size with greater than 50% respiratory variability, suggesting right atrial pressure of 3 mmHg. FINDINGS  Left Ventricle: Left ventricular ejection fraction, by estimation, is 60 to 65%. The left ventricle has normal function. The left ventricle has no regional wall motion abnormalities. The left ventricular internal cavity size was normal in size. There is  moderate left ventricular hypertrophy. Left ventricular diastolic parameters were normal. Right Ventricle: The right ventricular size is normal. No increase in right ventricular wall thickness. Right ventricular systolic function is normal. Left Atrium: Left atrial size was normal in size. Right Atrium: Right atrial size was normal in size. Pericardium: There is no evidence of pericardial effusion. Mitral Valve: The mitral valve is normal in structure. No evidence of mitral valve regurgitation. No evidence of mitral valve stenosis. Tricuspid Valve: The tricuspid valve is normal in structure. Tricuspid valve regurgitation is not demonstrated. No evidence of tricuspid stenosis. Aortic Valve: The aortic valve has an indeterminant number of cusps. There is mild calcification of the aortic valve. There is mild thickening of the aortic valve. There is mild aortic valve annular calcification. Aortic valve regurgitation is not visualized. No aortic stenosis is present. Aortic valve mean gradient measures 7.0 mmHg. Aortic valve peak gradient measures 13.0 mmHg. Aortic valve area, by VTI measures 1.85 cm. Pulmonic Valve: The pulmonic valve was not well visualized. Pulmonic valve regurgitation is not visualized. No evidence of pulmonic stenosis. Aorta: The aortic root is normal in size and structure. Pulmonary Artery: Indeterminant PASP, inadequate TR jet. Venous: The inferior vena cava is normal in size with greater than 50% respiratory variability, suggesting right atrial pressure of 3  mmHg. IAS/Shunts: No atrial level shunt detected by color flow Doppler.  LEFT VENTRICLE PLAX 2D LVIDd:         4.40 cm  Diastology LVIDs:         3.00 cm  LV e' medial:    11.60 cm/s LV PW:         1.40 cm  LV E/e' medial:  7.8 LV IVS:        1.50 cm  LV e' lateral:   16.80 cm/s LVOT diam:     2.10 cm  LV E/e' lateral: 5.4 LV SV:         74 LV SV Index:   33 LVOT Area:     3.46 cm  RIGHT VENTRICLE RV S prime:     13.30 cm/s TAPSE (M-mode): 2.2 cm LEFT ATRIUM             Index       RIGHT ATRIUM           Index LA diam:        3.20 cm 1.42 cm/m  RA Area:     19.40 cm LA Vol (A2C):   60.4 ml 26.84 ml/m RA Volume:   55.60 ml  24.71 ml/m LA Vol (A4C):   58.0 ml 25.78 ml/m LA Biplane Vol: 62.0 ml 27.56 ml/m  AORTIC VALVE AV Area (Vmax):    2.00 cm AV Area (Vmean):   2.01 cm AV Area (VTI):  1.85 cm AV Vmax:           180.00 cm/s AV Vmean:          125.000 cm/s AV VTI:            0.398 m AV Peak Grad:      13.0 mmHg AV Mean Grad:      7.0 mmHg LVOT Vmax:         104.00 cm/s LVOT Vmean:        72.400 cm/s LVOT VTI:          0.213 m LVOT/AV VTI ratio: 0.54  AORTA Ao Root diam: 3.20 cm MITRAL VALVE MV Area (PHT): 3.72 cm    SHUNTS MV Decel Time: 204 msec    Systemic VTI:  0.21 m MV E velocity: 90.80 cm/s  Systemic Diam: 2.10 cm MV A velocity: 63.00 cm/s MV E/A ratio:  1.44 Carlyle Dolly MD Electronically signed by Carlyle Dolly MD Signature Date/Time: 12/02/2019/3:59:43 PM    Final     Scheduled Meds: . apixaban  5 mg Oral BID  . citalopram  20 mg Oral Daily   And  . citalopram  40 mg Oral QHS  . clonazePAM  1 mg Oral BID  . irbesartan  150 mg Oral Daily   Or  . hydrochlorothiazide  12.5 mg Oral Daily  . mometasone-formoterol  2 puff Inhalation BID  . pantoprazole  40 mg Oral Daily  . simvastatin  20 mg Oral q1800   Continuous Infusions:    LOS: 2 days   Time spent: 52min  Miki C Annalaya Wile, DO [facility]  If 7PM-7AM, please contact  night-coverage [email or web address]  12/04/2019, 7:54 AM

## 2019-12-04 NOTE — Progress Notes (Signed)
LTM EEG hooked up and running - no initial skin breakdown - push button tested - neuro notified.  

## 2019-12-04 NOTE — Evaluation (Signed)
Physical Therapy Evaluation Patient Details Name: Jason Barajas MRN: [id number] DOB: [birth year] Today's Date: 12/04/2019   History of Present Illness  Patient is a 54 year old male who presented to the hospital with AMS. He fell 2x on 12/01/19 and became unresponsive. CT and MRI of head were negaitive. Concern for benzo withrawal and acute metabolic encephalopathy. Medical hx of the following: clotting disorder, colon polyps, COPD, DVT, HTN, and pulmonary embolism.  Clinical Impression  Pt was previously verbal and physically active recently and independent with all functional mobility without an assistive device, per pt's wife. However, the pt currently is not responding verbally or with hand movements or blinking upon being cued. He is also providing minimal to no active participation with all tasks this date. He was total assist for all bed mobility, but did display one moment of active participation with the first attempt to transfer sit to stand. However, he was unable to clear his buttocks and then became total assist for subsequent attempts with transfers without success. As the patient's current functional status varies greatly from baseline he would benefit from further skilled PT to address his deficits in coordination, balance, endurance, and strength in addition to other deficits mentioned below to maximize his safety and independence with all functional mobility.    Follow Up Recommendations Other (comment) (TBD as pt participation/condition improves)    Equipment Recommendations   (TBD as pt participation/condition improves)    Recommendations for Other Services       Precautions / Restrictions Precautions Precautions: Fall Precaution Comments: LTM EEG; foley catheter; seizure Restrictions Weight Bearing Restrictions: No      Mobility  Bed Mobility Overal bed mobility: Needs Assistance Bed Mobility: Rolling;Sidelying to Sit;Sit to Supine Rolling: Total assist Sidelying  to sit: Total assist   Sit to supine: Total assist   General bed mobility comments: Despite max VC's and TC's to utilize bed rails and manage LEs off and on bed, pt did not actively participate with bed mob. HOB elevated.  Transfers Overall transfer level: Needs assistance Equipment used: Rolling walker (2 wheeled) Transfers: Sit to/from Stand Sit to Stand: +2 physical assistance;Total assist (MaxAx2 first attempts, total assist final 2 attempts)         General transfer comment: Attempted STS from elevated EOB to RW, cuing pt to push off bed with hands and providing cues to lean trunk anteriorly, maxAx2 without buttocks clearance first attempt. 2 additional attempts with therapist lateral to pt and then attempted anterior to pt with L knee block and assistance under buttocks, total assist without success.  Ambulation/Gait                Stairs            Wheelchair Mobility    Modified Rankin (Stroke Patients Only)       Balance Overall balance assessment: Needs assistance Sitting-balance support: Bilateral upper extremity supported   Sitting balance - Comments: Pt sits statically EOB with B UE support and min guard, unable to follow directions to perform seated tasks.                                     Pertinent Vitals/Pain Pain Assessment: Faces Pain Score: 0-No pain Faces Pain Scale: No hurt Pain Intervention(s): Monitored during session (pt's wife denied any known pain )    Home Living Family/patient expects to be discharged to:: Private residence Living  Arrangements: Spouse/significant other Available Help at Discharge: Family;Available 24 hours/day (wife works 7-3 Mon-Fri; wife states she can find assistance ) Type of Home: House Home Access: Stairs to enter Entrance Stairs-Rails: Left Entrance Stairs-Number of Steps: 6 Home Layout: One level;Laundry or work area in basement (L handrail descending with B handrail at bottom to  basement) Home Equipment: Hand held shower head;Shower seat - built in;Walker - 2 wheels;Cane - single point;Bedside commode (some equipment from mother in law (pt has not used)) Additional Comments: wants to see his granddaughter [name] whose 41 mo old per wife/ enjoys fishing and racing    Prior Function Level of Independence: Independent         Comments: on disability prior to event, but still driving     Hand Dominance   Dominant Hand: Right    Extremity/Trunk Assessment   Upper Extremity Assessment Upper Extremity Assessment: Generalized weakness;Difficult to assess due to impaired cognition    Lower Extremity Assessment Lower Extremity Assessment: Generalized weakness;Difficult to assess due to impaired cognition       Communication   Communication: Receptive difficulties;Expressive difficulties (did not respond verbally or with hand or eye signals)  Cognition Arousal/Alertness: Awake/alert Behavior During Therapy: Flat affect Overall Cognitive Status: Impaired/Different from baseline Area of Impairment: Attention;Following commands;Awareness                   Current Attention Level: Divided   Following Commands:  (did not follow commands)       General Comments: Pt unable to respond to or follow cues, with minimal to no active participation noted this date.      General Comments General comments (skin integrity, edema, etc.): Noted wound on L knee from fall per pt's wife    Exercises     Assessment/Plan    PT Assessment Patient needs continued PT services  PT Problem List Decreased strength;Decreased activity tolerance;Decreased balance;Decreased mobility;Decreased coordination;Decreased cognition;Decreased knowledge of use of DME;Decreased safety awareness;Decreased knowledge of precautions;Decreased skin integrity;Cardiopulmonary status limiting activity       PT Treatment Interventions DME instruction;Gait training;Stair  training;Functional mobility training;Therapeutic activities;Therapeutic exercise;Balance training;Neuromuscular re-education;Cognitive remediation;Patient/family education    PT Goals (Current goals can be found in the Care Plan section)  Acute Rehab PT Goals Patient Stated Goal: family wants him to be able to come home PT Goal Formulation: With family Time For Goal Achievement: 12/18/19 Potential to Achieve Goals: Fair    Frequency Min 3X/week   Barriers to discharge        Co-evaluation               AM-PAC PT "6 Clicks" Mobility  Outcome Measure Help needed turning from your back to your side while in a flat bed without using bedrails?: Total Help needed moving from lying on your back to sitting on the side of a flat bed without using bedrails?: Total Help needed moving to and from a bed to a chair (including a wheelchair)?: Total Help needed standing up from a chair using your arms (e.g., wheelchair or bedside chair)?: Total Help needed to walk in hospital room?: Total Help needed climbing 3-5 steps with a railing? : Total 6 Click Score: 6    End of Session Equipment Utilized During Treatment: Gait belt;Oxygen Activity Tolerance: Treatment limited secondary to medical complications (Comment) (LTM EEG and pt not responding to cues) Patient left: in bed;with call bell/phone within reach;with family/visitor present (wife present during session) Nurse Communication:  (location of leads at end of  session) PT Visit Diagnosis: Unsteadiness on feet (R26.81);Muscle weakness (generalized) (M62.81);Difficulty in walking, not elsewhere classified (R26.2);Other symptoms and signs involving the nervous system (R29.898)    Time: 2550-0164 PT Time Calculation (min) (ACUTE ONLY): 21 min   Charges:   PT Evaluation $PT Eval High Complexity: 1 High          Moishe Spice, PT, DPT Acute Rehabilitation Services  Pager: [phone] Office: [phone]   Orvan Falconer 12/04/2019, 4:09 PM

## 2019-12-04 NOTE — Progress Notes (Addendum)
STROKE TEAM PROGRESS NOTE   INTERVAL HISTORY Wife at bedside. She states that yesterday the patient transitioned from being verbal with her to more non verbal. She was updated that due to encephalopathy MRI EEG have been ordered and that potentially he will undergo a lumbar puncture depending on the results of the MRI and EEG.   OBJECTIVE Vitals:   12/04/19 0804 12/04/19 1120 12/04/19 1232 12/04/19 1300  BP: (!) 160/92  (!) 152/94   Pulse: (!) 106  95   Resp: 19  (!) 21   Temp: 100 F (37.8 C)  99.3 F (37.4 C)   TempSrc: Oral  Oral   SpO2:   99%   Weight:  111.1 kg    Height:    6' (1.829 m)    CBC:  Recent Labs  Lab 12/01/19 1839 12/01/19 1840 12/02/19 0218 12/04/19 1007  WBC 8.3  --   --  11.8*  NEUTROABS 4.8  --   --   --   HGB 14.7   < > 13.9 15.2  HCT 46.2   < > 41.0 46.6  MCV 98.3  --   --  94.9  PLT 187  --   --  210   < > = values in this interval not displayed.    Basic Metabolic Panel:  Recent Labs  Lab 12/01/19 1839 12/01/19 1839 12/01/19 1840 12/01/19 2020 12/02/19 0218 12/04/19 1007  NA 136   < > 137   < > 136 137  K 3.9   < > 3.8   < > 3.8 4.0  CL 101   < > 99  --   --  102  CO2 23  --   --   --   --  20*  GLUCOSE 78   < > 76  --   --  96  BUN 8   < > 9  --   --  10  CREATININE 0.77   < > 0.80  --   --  0.85  CALCIUM 8.5*  --   --   --   --  8.9   < > = values in this interval not displayed.    Lipid Panel:     Component Value Date/Time   CHOL 120 12/03/2019 0020   TRIG 63 12/03/2019 0020   HDL 31 (L) 12/03/2019 0020   CHOLHDL 3.9 12/03/2019 0020   VLDL 13 12/03/2019 0020   LDLCALC 76 12/03/2019 0020   HgbA1c:  Lab Results  Component Value Date   HGBA1C 5.5 12/03/2019   Urine Drug Screen:     Component Value Date/Time   LABOPIA NONE DETECTED 12/01/2019 1918   COCAINSCRNUR NONE DETECTED 12/01/2019 1918   LABBENZ POSITIVE (A) 12/01/2019 1918   AMPHETMU NONE DETECTED 12/01/2019 1918   THCU NONE DETECTED 12/01/2019 1918    LABBARB NONE DETECTED 12/01/2019 1918    Alcohol Level     Component Value Date/Time   ETH <10 12/01/2019 2024    IMAGING  CT Cervical Spine Wo Contrast Result Date: 12/01/2019 CLINICAL DATA:  Neck trauma EXAM: CT CERVICAL SPINE WITHOUT CONTRAST TECHNIQUE: Multidetector CT imaging of the cervical spine was performed without intravenous contrast. Multiplanar CT image reconstructions were also generated. COMPARISON:  None. FINDINGS: Alignment: No subluxation.  Facet alignment within normal limits. Skull base and vertebrae: No acute fracture. No primary bone lesion or focal pathologic process. Soft tissues and spinal canal: No prevertebral fluid or swelling. No visible canal hematoma. Disc levels:  Mild diffuse degenerative changes at multiple levels. Upper chest: Negative. Other: None IMPRESSION: Mild degenerative changes of the cervical spine. No acute osseous abnormality. Electronically Signed   By: Donavan Foil M.D.   On: 12/01/2019 19:17   MR BRAIN WO CONTRAST Result Date: 12/01/2019 CLINICAL DATA:  54 year old male code stroke presentation. EXAM: MRI HEAD WITHOUT CONTRAST TECHNIQUE: Multiplanar, multiecho pulse sequences of the brain and surrounding structures were obtained without intravenous contrast. COMPARISON:  Plain head CT and CTA head and neck. Brain MRI 11/06/2004. FINDINGS: Brain: No restricted diffusion to suggest acute infarction. No midline shift, mass effect, evidence of mass lesion, ventriculomegaly, extra-axial collection or acute intracranial hemorrhage. Cervicomedullary junction and pituitary are within normal limits. Pearline Cables and white matter signal remains within normal limits throughout the brain. No convincing encephalomalacia. No chronic cerebral blood products. Vascular: Major intracranial vascular flow voids are stable since 2006. Skull and upper cervical spine: Negative. Sinuses/Orbits: Negative. Other: Mastoids are clear. Grossly negative visible internal auditory structures,  scalp and face soft tissues. IMPRESSION: Normal noncontrast MRI appearance of the brain. Electronically Signed   By: Genevie Ann M.D.   On: 12/01/2019 21:52   DG Chest Portable 1 View Result Date: 12/01/2019 CLINICAL DATA:  Shortness of breath EXAM: PORTABLE CHEST 1 VIEW COMPARISON:  CT 09/14/2018, radiograph 09/14/2018 FINDINGS: Chronically coarsened interstitial changes towards the lung bases with apical lucency compatible with areas of emphysematous change and basilar scarring particularly within the lingula. These are not significantly changed from comparison radiography. No new consolidative opacity. The cardiomediastinal contours are unremarkable. Telemetry leads overlie the chest. No acute osseous or soft tissue abnormality. Degenerative changes are present in the imaged spine and shoulders. IMPRESSION: Chronically coarsened interstitial changes and apical lucency compatible with areas of basilar scarring and emphysematous features better seen on comparison CT. No acute cardiopulmonary abnormality. Electronically Signed   By: Lovena Le M.D.   On: 12/01/2019 19:34   CT HEAD CODE STROKE WO CONTRAST Result Date: 12/01/2019 CLINICAL DATA:  Code stroke.  54 year old male EXAM: CT HEAD WITHOUT CONTRAST TECHNIQUE: Contiguous axial images were obtained from the base of the skull through the vertex without intravenous contrast. COMPARISON:  Brain MRI 11/07/2003. FINDINGS: Brain: Cerebral volume is not significantly changed since 2006. No midline shift, ventriculomegaly, mass effect, evidence of mass lesion, intracranial hemorrhage or evidence of cortically based acute infarction. Gray-white matter differentiation is within normal limits throughout the brain. No cortical encephalomalacia identified. Vascular: Calcified atherosclerosis at the skull base. No suspicious intracranial vascular hyperdensity. Skull: Negative. Sinuses/Orbits: Visualized paranasal sinuses and mastoids are clear. Other: Visualized orbits  and scalp soft tissues are within normal limits. ASPECTS [facility] Stroke Program Early CT Score) Total score (0-10 with 10 being normal): 10 IMPRESSION: 1. Normal noncontrast CT appearance of the brain. ASPECTS 10. 2. These results were communicated to Dr. Theda Sers at 7:03 pm on 12/01/2019 by text page via the [facility] messaging system. Electronically Signed   By: Genevie Ann M.D.   On: 12/01/2019 19:03   CT ANGIO HEAD CODE STROKE Result Date: 12/01/2019 CLINICAL DATA:  54 year old male code stroke presentation. EXAM: CT ANGIOGRAPHY HEAD AND NECK TECHNIQUE: Multidetector CT imaging of the head and neck was performed using the standard protocol during bolus administration of intravenous contrast. Multiplanar CT image reconstructions and MIPs were obtained to evaluate the vascular anatomy. Carotid stenosis measurements (when applicable) are obtained utilizing NASCET criteria, using the distal internal carotid diameter as the denominator. CONTRAST:  29mL OMNIPAQUE IOHEXOL 350 MG/ML SOLN COMPARISON:  Plain head CT 1855 hours today. FINDINGS: CTA NECK Skeleton: No acute osseous abnormality identified. Upper chest: Paraseptal emphysema in the medial left upper lobe. Atelectatic changes to the trachea. No superior mediastinal lymphadenopathy. Other neck: Reflux of venous contrast from the injected left subclavian into the paravertebral venous system greater on the left. No acute finding in the neck. Aortic arch: 4 vessel arch configuration. The left vertebral arises directly from the arch. Soft plaque in the arch and at most of the great vessel origins. Right carotid system: Soft plaque at the brachiocephalic artery origin without significant stenosis. Normal right CCA. Negative right carotid bifurcation and cervical right ICA. Left carotid system: Soft plaque at the left CCA origin without stenosis. Mild plaque at the left ICA origin and bulb without stenosis. Vertebral arteries: Mild plaque in the proximal right subclavian  artery without stenosis. Normal right vertebral artery origin. Right vertebral is patent and normal to the skull base. Left vertebral arises directly from the arch with minimal plaque at its origin. Patent left vertebral to the skull base with no plaque or stenosis identified. CTA HEAD Posterior circulation: Patent distal vertebral arteries and vertebrobasilar junction. The right V4 segment is mildly dominant. There is mild irregularity and stenosis in the distal left V4 segment. Normal right PICA origin. The left AICA may be dominant. Patent SCA and PCA origins. Posterior communicating arteries are diminutive or absent. Bilateral PCA branches are within normal limits. Anterior circulation: Both ICA siphons are patent. On the left there is mild plaque without stenosis. Minimal plaque on the right without stenosis. Patent carotid termini. Patent MCA and ACA origins. Mildly dominant left A1. Anterior communicating artery and bilateral ACA branches are within normal limits. Left MCA M1 segment and trifurcation are patent without stenosis. Left MCA branches are within normal limits. Right MCA M1 segment and bifurcation are patent without stenosis. Right MCA branches are within normal limits. Venous sinuses: Patent. Anatomic variants: Left vertebral artery arises directly from the arch and is mildly non dominant. Mildly dominant left ACA A1. Review of the MIP images confirms the above findings IMPRESSION: 1. Negative for large vessel occlusion. 2. Generally mild atherosclerosis in the head and neck. No significant arterial stenosis identified. 3. Emphysema (ICD10-J43.9). Vascular results were communicated to Dr. Lorrin Goodell at 7:20 pmon 10/1/2021by text page via the [facility] messaging system. Electronically Signed   By: Genevie Ann M.D.   On: 12/01/2019 19:20   CT ANGIO NECK CODE STROKE Result Date: 12/01/2019 CLINICAL DATA:  54 year old male code stroke presentation. EXAM: CT ANGIOGRAPHY HEAD AND NECK TECHNIQUE:  Multidetector CT imaging of the head and neck was performed using the standard protocol during bolus administration of intravenous contrast. Multiplanar CT image reconstructions and MIPs were obtained to evaluate the vascular anatomy. Carotid stenosis measurements (when applicable) are obtained utilizing NASCET criteria, using the distal internal carotid diameter as the denominator. CONTRAST:  67mL OMNIPAQUE IOHEXOL 350 MG/ML SOLN COMPARISON:  Plain head CT 1855 hours today. FINDINGS: CTA NECK Skeleton: No acute osseous abnormality identified. Upper chest: Paraseptal emphysema in the medial left upper lobe. Atelectatic changes to the trachea. No superior mediastinal lymphadenopathy. Other neck: Reflux of venous contrast from the injected left subclavian into the paravertebral venous system greater on the left. No acute finding in the neck. Aortic arch: 4 vessel arch configuration. The left vertebral arises directly from the arch. Soft plaque in the arch and at most of the great vessel origins. Right carotid system: Soft plaque at the brachiocephalic artery origin  without significant stenosis. Normal right CCA. Negative right carotid bifurcation and cervical right ICA. Left carotid system: Soft plaque at the left CCA origin without stenosis. Mild plaque at the left ICA origin and bulb without stenosis. Vertebral arteries: Mild plaque in the proximal right subclavian artery without stenosis. Normal right vertebral artery origin. Right vertebral is patent and normal to the skull base. Left vertebral arises directly from the arch with minimal plaque at its origin. Patent left vertebral to the skull base with no plaque or stenosis identified. CTA HEAD Posterior circulation: Patent distal vertebral arteries and vertebrobasilar junction. The right V4 segment is mildly dominant. There is mild irregularity and stenosis in the distal left V4 segment. Normal right PICA origin. The left AICA may be dominant. Patent SCA and PCA  origins. Posterior communicating arteries are diminutive or absent. Bilateral PCA branches are within normal limits. Anterior circulation: Both ICA siphons are patent. On the left there is mild plaque without stenosis. Minimal plaque on the right without stenosis. Patent carotid termini. Patent MCA and ACA origins. Mildly dominant left A1. Anterior communicating artery and bilateral ACA branches are within normal limits. Left MCA M1 segment and trifurcation are patent without stenosis. Left MCA branches are within normal limits. Right MCA M1 segment and bifurcation are patent without stenosis. Right MCA branches are within normal limits. Venous sinuses: Patent. Anatomic variants: Left vertebral artery arises directly from the arch and is mildly non dominant. Mildly dominant left ACA A1. Review of the MIP images confirms the above findings IMPRESSION: 1. Negative for large vessel occlusion. 2. Generally mild atherosclerosis in the head and neck. No significant arterial stenosis identified. 3. Emphysema (ICD10-J43.9). Vascular results were communicated to Dr. Lorrin Goodell at 7:20 pmon 10/1/2021by text page via the [facility] messaging system. Electronically Signed   By: Genevie Ann M.D.   On: 12/01/2019 19:20   Transthoracic Echocardiogram  1. Left ventricular ejection fraction, by estimation, is 60 to 65%. The  left ventricle has normal function. The left ventricle has no regional  wall motion abnormalities. There is moderate left ventricular hypertrophy.  Left ventricular diastolic  parameters were normal.  2. Right ventricular systolic function is normal. The right ventricular  size is normal.  3. The mitral valve is normal in structure. No evidence of mitral valve  regurgitation. No evidence of mitral stenosis.  4. The aortic valve has an indeterminant number of cusps. There is mild  calcification of the aortic valve. There is mild thickening of the aortic  valve. Aortic valve regurgitation is not  visualized. No aortic stenosis is  present.  5. The inferior vena cava is normal in size with greater than 50%  respiratory variability, suggesting right atrial pressure of 3 mmHg.   ECG - SR rate 70 BPM. (See cardiology reading for complete details)  EEG This study is within normal limits.No seizures or epileptiform discharges were seen throughout the recording.   PHYSICAL EXAM  Temp:  [98.7 F (37.1 C)-100 F (37.8 C)] 99.3 F (37.4 C) (10/04 1232) Pulse Rate:  [82-106] 95 (10/04 1232) Resp:  [19-21] 21 (10/04 1232) BP: (136-161)/(78-95) 152/94 (10/04 1232) SpO2:  [92 %-99 %] 99 % (10/04 1232) Weight:  [111.1 kg] 111.1 kg (10/04 1120)  General - Well nourished, well developed, in no apparent distress, lethargic.  Ophthalmologic - fundi not visualized due to noncooperation.  Cardiovascular - Regular rhythm and rate.  Neuro - awake alert, eyes open, not oriented to person place time or situation.  Does not name  or repeat. Does follow very simple commands- lifts BUE and BLE. PERRL, EOMI, visual field full. Facial symmetrical, tongue midline. BUE 4/5 but improved asterixis. BLE 3/5 proximal and 4/5 distally. FTN slow but grossly intact. Sensation symmetrical. Gait not tested.    ASSESSMENT/PLAN Mr. GARRUS GAUTHREAUX is a 54 y.o. male with history of DVT, PE, clotting disorder (Eliquis), COPD, tobacco use, HTN, and HLD brought to [facility] after he was found down and unresponsive by his family.Marland Kitchen He did not receive IV t-PA due to Eliquis therapy.   Seizure vs. Pre-syncope vs. Encephalopathy due to medication   Resultant fall at home x 2, AMS, non-focal, asterixis  CT Head - Normal noncontrast CT appearance of the brain. A  MRI head - Normal noncontrast MRI appearance of the brain.  CTA H&N - Negative for large vessel occlusion. Generally mild atherosclerosis in the head and neck.   2D Echo - EF 60-65%  12/02/19 EEG without seizure, EEG initiated on 104//21- results are pending    MRI Brain W WO Contrast with no evidence of acute intracranial abnormality   Lacey Jensen Virus 2 - negative  LDL - 76  HgbA1c - 5.5  UDS - benzo + (on home Xanax)  VTE prophylaxis - Eliquis 2.5mg  bid  Eliquis 2.5 bid prior to admission, now on Eliquis 5 bid. This was discontinued on 10/4 as a lumbar puncture will possibly be obtained in the near future. He was started on IV heparin in its place which has a short half life and can be paused prior to a lumbar puncture.   Do not feel long term AED is needed at this time.  Patient counseled to be compliant with his antithrombotic medications  Ongoing aggressive stroke risk factor management  Therapy recommendations:  OT recommends CIR   Disposition:  Pending, he is not medically ready to discharge at this time   ?? 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  Put on Eliquis 5 mg twice daily on discharge  Per pharmacy, in 2019, patient had rectal bleeding, Eliquis down to 2.5 twice daily  Discussed with pharmacy, put on Eliquis 5 mg twice daily ( stopped on 12/04/19 due to possible LP can resume after it is decided if LP is needed or not, in the interim he is on heparin for anticoagulation)   Close monitoring bleeding  Hypertension  Home BP meds: irbesartan ; HCTZ  Current BP meds: olmasartan ; HCTZ  Stable - mildly low at times . Long-term BP goal normotensive  Hyperlipidemia  Home Lipid lowering medication: Zocor 20 mg daily  LDL - 76, goal < 70  Resumed zocor 20  Continue statin at discharge  Tobacco abuse  Current smoker  Smoking cessation counseling provided  Pt is willing to quit  Other Stroke Risk Factor  Previous ETOH   Obesity, recommend weight  loss, diet and exercise as appropriate   Other Active Problems  Code status - Full code   Hospital day # 2   Ruta Hinds, NP  [facility] Nurse Practitioner  12/04/2019 2:35 PM  ATTENDING NOTE: I reviewed above note and agree with the assessment and plan. Pt was seen and examined.   Wife at the bedside.  RN reported that  patient mental status seems getting worse today, nonverbal.  Overnight no acute event.  On exam, patient eyes open, able to track bilaterally, however significant psychomotor slowing.  Nonverbal, able to make sounds intermittently but not words.  Still moving all extremities, however slow, improved asterixis than before.  No focal deficit, no nuchal rigidity.  MRI with and without contrast negative.  EEG long-term did not show seizure activity on preliminary read.  ABG negative.  Ammonia level normal.  Had one-time temperature 100 Fahrenheit, but then afebrile.  No obvious clinical sign for meningitis or encephalitis at this time.  However, not able to do LP given on Eliquis.  Currently changed to heparin IV in case need LP in 2 days. Will do blood culture. Per wife, pt not using alcohol at home. UDS neg this admission except positive on benzo.  Per wife, patient has heavy Xanax use at home, started clonazepam 1 g twice daily yesterday, however, seems not much improvement so far.  Given all exam and imaging and lab findings, patient condition still more consistent with metabolic encephalopathy, etiology unclear. Encephalitis, serotonin syndrome still on the differential is but less likely. However, if work-up unrevealing, recommend LP Wednesday for further evaluation.  I had long discussion with wife at bedside, updated pt current condition, treatment plan and potential prognosis, and answered all the questions. She expressed understanding and appreciation.    Rosalin Hawking, MD PhD Stroke Neurology 12/04/2019 7:32 PM   I spent 35 minutes in total face-to-face  time with the patient, more than 50% of which was spent in counseling and coordination of care, reviewing test results, images and medication, and discussing the diagnosis, treatment plan and potential prognosis. This patient's care requiresreview of multiple databases, neurological assessment, discussion with family, other specialists and medical decision making of high complexity. I had long discussion with wife at bedside, updated pt current condition, treatment plan and potential prognosis, and answered all the questions. She expressed understanding and appreciation.   To contact Stroke Continuity provider, please refer to [email or web address] After hours, contact General Neurology

## 2019-12-04 NOTE — Progress Notes (Signed)
ANTICOAGULATION CONSULT NOTE - Follow Up Consult  Pharmacy Consult for heparin Indication: h/o VTE  Labs: Recent Labs    12/02/19 0157 12/02/19 0218 12/04/19 1007 12/04/19 2148  HGB  --  13.9 15.2  --   HCT  --  41.0 46.6  --   PLT  --   --  210  --   APTT  --   --   --  55*  HEPARINUNFRC  --   --   --  0.92*  CREATININE  --   --  0.85  --   CKTOTAL 71  --   --   --     Assessment: 54yo male subtherapeutic on heparin with initial dosing while Eliquis on hold; no gtt issues or signs of bleeding per RN.  Goal of Therapy:  aPTT 66-102 seconds Monitor platelets by anticoagulation protocol: Yes   Plan:  Will increase heparin gtt by 3 units/kg/hr to 1800 units/hr and check PTT in 6hr.  Wynona Neat, PharmD, BCPS  12/04/2019,11:57 PM

## 2019-12-05 DIAGNOSIS — W19XXXA Unspecified fall, initial encounter: Secondary | ICD-10-CM | POA: Diagnosis not present

## 2019-12-05 DIAGNOSIS — F32A Depression, unspecified: Secondary | ICD-10-CM | POA: Diagnosis not present

## 2019-12-05 DIAGNOSIS — R410 Disorientation, unspecified: Secondary | ICD-10-CM | POA: Diagnosis not present

## 2019-12-05 DIAGNOSIS — J42 Unspecified chronic bronchitis: Secondary | ICD-10-CM | POA: Diagnosis not present

## 2019-12-05 DIAGNOSIS — R401 Stupor: Secondary | ICD-10-CM | POA: Diagnosis not present

## 2019-12-05 DIAGNOSIS — R4182 Altered mental status, unspecified: Secondary | ICD-10-CM | POA: Diagnosis not present

## 2019-12-05 LAB — BASIC METABOLIC PANEL
Anion gap: 13 (ref 5–15)
BUN: 15 mg/dL (ref 6–20)
CO2: 23 mmol/L (ref 22–32)
Calcium: 8.7 mg/dL — ABNORMAL LOW (ref 8.9–10.3)
Chloride: 103 mmol/L (ref 98–111)
Creatinine, Ser: 0.76 mg/dL (ref 0.61–1.24)
GFR calc Af Amer: >60 mL/min (ref >60)
GFR calc non Af Amer: >60 mL/min (ref >60)
Glucose, Bld: 90 mg/dL (ref 70–99)
Potassium: 3.9 mmol/L (ref 3.5–5.1)
Sodium: 139 mmol/L (ref 135–145)

## 2019-12-05 LAB — CBC
HCT: 43.7 % (ref 39.0–52.0)
Hemoglobin: 13.9 g/dL (ref 13.0–17.0)
MCH: 31.1 pg (ref 26.0–34.0)
MCHC: 31.8 g/dL (ref 30.0–36.0)
MCV: 97.8 fL (ref 80.0–100.0)
Platelets: 201 10*3/uL (ref 150–400)
RBC: 4.47 MIL/uL (ref 4.22–5.81)
RDW: 13.3 % (ref 11.5–15.5)
WBC: 8.3 10*3/uL (ref 4.0–10.5)
nRBC: 0 % (ref 0.0–0.2)

## 2019-12-05 LAB — APTT
aPTT: 90 seconds — ABNORMAL HIGH (ref 24–36)
aPTT: 77 seconds — ABNORMAL HIGH (ref 24–36)

## 2019-12-05 LAB — HEPARIN LEVEL (UNFRACTIONATED): Heparin Unfractionated: 0.82 IU/mL — ABNORMAL HIGH (ref 0.30–0.70)

## 2019-12-05 MED ORDER — HALOPERIDOL LACTATE 5 MG/ML IJ SOLN
2.0000 mg | Freq: Four times a day (QID) | INTRAMUSCULAR | Status: DC | PRN
  Administered 2019-12-05: 2 mg via INTRAMUSCULAR
  Filled 2019-12-05: qty 1

## 2019-12-05 MED ORDER — FOLIC ACID 1 MG PO TABS
1.0000 mg | ORAL_TABLET | Freq: Every day | ORAL | Status: DC
  Administered 2019-12-05 – 2019-12-12 (×8): 1 mg via ORAL
  Filled 2019-12-05 (×8): qty 1

## 2019-12-05 MED ORDER — HYDROCHLOROTHIAZIDE 12.5 MG PO CAPS
12.5000 mg | ORAL_CAPSULE | Freq: Every day | ORAL | Status: DC
  Administered 2019-12-05 – 2019-12-12 (×8): 12.5 mg via ORAL
  Filled 2019-12-05 (×7): qty 1

## 2019-12-05 MED ORDER — LORAZEPAM 2 MG/ML IJ SOLN
1.0000 mg | INTRAMUSCULAR | Status: DC | PRN
  Administered 2019-12-05 – 2019-12-06 (×3): 2 mg via INTRAVENOUS
  Filled 2019-12-05 (×5): qty 1

## 2019-12-05 MED ORDER — LORAZEPAM 1 MG PO TABS
1.0000 mg | ORAL_TABLET | ORAL | Status: AC | PRN
  Administered 2019-12-07 – 2019-12-08 (×2): 2 mg via ORAL
  Filled 2019-12-05 (×3): qty 2

## 2019-12-05 MED ORDER — APIXABAN 5 MG PO TABS
5.0000 mg | ORAL_TABLET | Freq: Two times a day (BID) | ORAL | Status: DC
  Administered 2019-12-05 – 2019-12-12 (×15): 5 mg via ORAL
  Filled 2019-12-05 (×15): qty 1

## 2019-12-05 MED ORDER — ADULT MULTIVITAMIN W/MINERALS CH
1.0000 | ORAL_TABLET | Freq: Every day | ORAL | Status: DC
  Administered 2019-12-05 – 2019-12-12 (×8): 1 via ORAL
  Filled 2019-12-05 (×8): qty 1

## 2019-12-05 NOTE — Progress Notes (Addendum)
PROGRESS NOTE    Jason Barajas  VQQ:[id number] DOB: 24-Jun-1965 DOA: 12/01/2019 PCP: Ronita Hipps, MD   Brief Narrative:  Jason Barajas is a 54 y.o. male with medical history significant of anxiety, depression, COPD, PE/DVT on Eliquis, GERD, hypertension, hyperlipidemia, IBS presenting to the ED with altered mental status. Patient is very somnolent and no history could be obtained from him. History provided by wife at bedside who states that yesterday patient had an unwitnessed fall. Then again today he had another unwitnessed fall. States this evening patient was acting normal and talking to her and another neighbor. Then all of a sudden his speech became slurred and he became unresponsive.  His eyes were still open and he did not lose consciousness.  He takes Xanax 1 mg up to 5 times a day for anxiety.  He also takes gabapentin for panic attacks.  States she fill out his pillbox and keeps the rest of the medication with her. She is not sure how much Xanax he took today. Wife states patient has depression for which he takes citalopram and is seen by a psychiatrist. States patient has not expressed any suicidal thoughts. States he has chronic shortness of breath due to COPD but has not had any fevers, cough, nausea, vomiting, or diarrhea.  He has not complained of any abdominal pain.  Wife confirms that he does not drink any alcohol.  He has been vaccinated against Covid. In ED: Initially code stroke was activated and patient was seen by neurology.  TPA not given as he is on Eliquis.  Head CT normal.  CT angiogram head and neck negative for LVO.  Brain MRI normal. WBC 8.3, hemoglobin 14.7, hematocrit 46.2, platelet 187K.  Sodium 136, potassium 3.9, chloride 101, bicarb 23, BUN 8, creatinine 0.7, glucose 78.  LFTs normal.  INR 1.1.  UA not suggestive of infection.  UDS positive for benzodiazepines (patient takes Xanax at home).  SARS-CoV-2 PCR test negative.  Influenza panel negative.  Blood ethanol  level undetectable.  Salicylate and acetaminophen levels pending.  Chest x-ray negative for acute cardiopulmonary abnormality.  CT C-spine negative for acute osseous abnormality.  EKG without acute changes. ABG with pH 7.33, PCO2 54, PO2 60.  He was placed on BiPAP briefly which did not improve his mental status. Patient received 1 L IV fluid bolus.   Assessment & Plan:   Principal Problem:   AMS (altered mental status) Active Problems:   COPD (chronic obstructive pulmonary disease) (HCC)   Fall   History of pulmonary embolism   Depression   Acute metabolic encephalopathy, likely multifactorial, somewhat worsening overnight Likely polypharmacy with subsequent withdrawal Rule out acute intracranial process -Patient's mental status continues to be markedly labile over the past 48 hours, it appears that his mental status improvements occur within a few hours of administration of Klonopin and decline over time after administration as this medication wears off -We will place patient on CIWA protocol, he denies as does his wife any alcohol use but given his improvement with benzos and concern for withdrawal and withdrawal seizure in the setting of flumazenil at admission with negative repeat MRI and EEG this seems the most prudent course of action -Neurology following, appreciate insight and recommendations, possible LP in the next 48 hours as he is now on heparin drip holding Eliquis to rule out any CNS infectious or inflammatory process although less likely - CT head neck MRI without acute findings/process at intake, repeat MRI unremarkable -Initial EEG negative  for seizure-like activity, repeat EEG also unremarkable -Interestingly patient appears to be able to follow even complex commands but is unable to communicate, consistent with Broca's aphasia but again his MRI has been negative x2.  Unwitnessed falls, prior to admission:  - Unclear if these were syncopal events.  EKG not suggestive of  arrhythmia.  PE less likely given no tachycardia and he is already on chronic anticoagulation with Eliquis. -Continue cardiac monitoring. Echocardiogram unremarkable.   - PT/OT evaluation given mental status improving as above, able to follow simple commands.  Anxiety, depression -Hold home medications at this time  COPD:  -No acute exacerbation, given minimally elevated CO2 at admission at 73 and only mildly acidotic - would presume his CO2 baseline is around 50  History of PE/DVT -Continue Eliquis  DVT prophylaxis:  Transition from Eliquis to heparin drip as above on 12/04/2019 Code Status: Full code Family Communication: Wife at bedside  Status is: Inpatient  Dispo: The patient is from: Home              Anticipated d/c is to: To be determined              Anticipated d/c date is: 72 to 96 hours pending clinical course and further findings              Patient currently not medically stable for discharge given worsening mental status, respiratory status and severe hypoxia  Consultants:   Neurology  Procedures:   EEG x2, echocardiogram  Antimicrobials:  Not indicated  Subjective: No acute issues or events overnight, overnight and early this morning was much more verbal, however over the early hours of the morning patient became more confused and unable to communicate in any meaningful way, otherwise awake and alert, patient able to shake his head yes or no and denies any nausea, vomiting, diarrhea, constipation, headache, fevers, chills.  He is able to follow commands but is unable to communicate otherwise with somewhat word salad speech.  Objective: Vitals:   12/04/19 1932 12/04/19 2328 12/05/19 0431 12/05/19 0822  BP: (!) 145/97 (!) 163/91 (!) 151/87 (!) 152/96  Pulse: 85 77 63 82  Resp: 20 18 18 20   Temp: 98.7 F (37.1 C) 98.7 F (37.1 C) 98.1 F (36.7 C) 98.4 F (36.9 C)  TempSrc: Axillary Oral Oral Axillary  SpO2: 98% 97% 98% 96%  Weight:      Height:         Intake/Output Summary (Last 24 hours) at 12/05/2019 1135 Last data filed at 12/05/2019 3976 Gross per 24 hour  Intake 486.9 ml  Output 425 ml  Net 61.9 ml   Filed Weights   12/04/19 1120  Weight: 111.1 kg    Examination:  General: Pleasantly resting in bed no acute distress awake, unable to assess orientation given "word salad" HEENT:  Normocephalic atraumatic.  Sclerae nonicteric, noninjected.  Extraocular movements intact bilaterally. Neck:  Without mass or deformity.  Trachea is midline. Lungs:  Clear to auscultate bilaterally without rhonchi, wheeze, or rales. Heart:  Regular rate and rhythm.  Without murmurs, rubs, or gallops. Abdomen:  Soft, nontender, nondistended.  Without guarding or rebound. Extremities: Without cyanosis, clubbing, edema, or obvious deformity, no obvious deficits Vascular:  Dorsalis pedis and posterior tibial pulses palpable bilaterally. Skin:  Warm and dry, no erythema, no ulcerations.  Data Reviewed: I have personally reviewed following labs and imaging studies  CBC: Recent Labs  Lab 12/01/19 1839 12/01/19 1839 12/01/19 1840 12/01/19 2020 12/02/19 0218 12/04/19 1007  12/05/19 0441  WBC 8.3  --   --   --   --  11.8* 8.3  NEUTROABS 4.8  --   --   --   --   --   --   HGB 14.7   < > 15.3 14.3 13.9 15.2 13.9  HCT 46.2   < > 45.0 42.0 41.0 46.6 43.7  MCV 98.3  --   --   --   --  94.9 97.8  PLT 187  --   --   --   --  210 201   < > = values in this interval not displayed.   Basic Metabolic Panel: Recent Labs  Lab 12/01/19 1839 12/01/19 1839 12/01/19 1840 12/01/19 2020 12/02/19 0218 12/04/19 1007 12/05/19 0441  NA 136   < > 137 137 136 137 139  K 3.9   < > 3.8 3.8 3.8 4.0 3.9  CL 101  --  99  --   --  102 103  CO2 23  --   --   --   --  20* 23  GLUCOSE 78  --  76  --   --  96 90  BUN 8  --  9  --   --  10 15  CREATININE 0.77  --  0.80  --   --  0.85 0.76  CALCIUM 8.5*  --   --   --   --  8.9 8.7*   < > = values in this interval not  displayed.   GFR: Estimated Creatinine Clearance: 135.9 mL/min (by C-G formula based on SCr of 0.76 mg/dL). Liver Function Tests: Recent Labs  Lab 12/01/19 1839 12/04/19 1007  AST 15 20  ALT 15 16  ALKPHOS 54 70  BILITOT 0.8 1.4*  PROT 6.1* 6.8  ALBUMIN 3.5 3.5   No results for input(s): LIPASE, AMYLASE in the last 168 hours. Recent Labs  Lab 12/02/19 0046 12/04/19 1541  AMMONIA 46* 26   Coagulation Profile: Recent Labs  Lab 12/01/19 1839  INR 1.1   Cardiac Enzymes: Recent Labs  Lab 12/02/19 0157  CKTOTAL 71   BNP (last 3 results) No results for input(s): PROBNP in the last 8760 hours. HbA1C: Recent Labs    12/03/19 0020  HGBA1C 5.5   CBG: Recent Labs  Lab 12/01/19 1833 12/04/19 0809  GLUCAP 76 88   Lipid Profile: Recent Labs    12/03/19 0020  CHOL 120  HDL 31*  LDLCALC 76  TRIG 63  CHOLHDL 3.9   Thyroid Function Tests: No results for input(s): TSH, T4TOTAL, FREET4, T3FREE, THYROIDAB in the last 72 hours. Anemia Panel: No results for input(s): VITAMINB12, FOLATE, FERRITIN, TIBC, IRON, RETICCTPCT in the last 72 hours. Sepsis Labs: No results for input(s): PROCALCITON, LATICACIDVEN in the last 168 hours.  Recent Results (from the past 240 hour(s))  Respiratory Panel by RT PCR (Flu A&B, Covid) - Nasopharyngeal Swab     Status: None   Collection Time: 12/01/19  7:28 PM   Specimen: Nasopharyngeal Swab  Result Value Ref Range Status   SARS Coronavirus 2 by RT PCR NEGATIVE NEGATIVE Final    Comment: (NOTE) SARS-CoV-2 target nucleic acids are NOT DETECTED.  The SARS-CoV-2 RNA is generally detectable in upper respiratoy specimens during the acute phase of infection. The lowest concentration of SARS-CoV-2 viral copies this assay can detect is 131 copies/mL. A negative result does not preclude SARS-Cov-2 infection and should not be used as the sole basis for treatment  or other patient management decisions. A negative result may occur with   improper specimen collection/handling, submission of specimen other than nasopharyngeal swab, presence of viral mutation(s) within the areas targeted by this assay, and inadequate number of viral copies (<131 copies/mL). A negative result must be combined with clinical observations, patient history, and epidemiological information. The expected result is Negative.  Fact Sheet for Patients:  PinkCheek.be  Fact Sheet for Healthcare Providers:  GravelBags.it  This test is no t yet approved or cleared by the [place] FDA and  has been authorized for detection and/or diagnosis of SARS-CoV-2 by FDA under an Emergency Use Authorization (EUA). This EUA will remain  in effect (meaning this test can be used) for the duration of the COVID-19 declaration under Section 564(b)(1) of the Act, 21 U.S.C. section 360bbb-3(b)(1), unless the authorization is terminated or revoked sooner.     Influenza A by PCR NEGATIVE NEGATIVE Final   Influenza B by PCR NEGATIVE NEGATIVE Final    Comment: (NOTE) The Xpert Xpress SARS-CoV-2/FLU/RSV assay is intended as an aid in  the diagnosis of influenza from Nasopharyngeal swab specimens and  should not be used as a sole basis for treatment. Nasal washings and  aspirates are unacceptable for Xpert Xpress SARS-CoV-2/FLU/RSV  testing.  Fact Sheet for Patients: PinkCheek.be  Fact Sheet for Healthcare Providers: GravelBags.it  This test is not yet approved or cleared by the [place] FDA and  has been authorized for detection and/or diagnosis of SARS-CoV-2 by  FDA under an Emergency Use Authorization (EUA). This EUA will remain  in effect (meaning this test can be used) for the duration of the  Covid-19 declaration under Section 564(b)(1) of the Act, 21  U.S.C. section 360bbb-3(b)(1), unless the authorization is  terminated or  revoked. Performed at [facility], [place] [address] [place], [place] [address]   Culture, blood (Routine X 2) w Reflex to ID Panel     Status: None (Preliminary result)   Collection Time: 12/04/19  9:48 PM   Specimen: BLOOD  Result Value Ref Range Status   Specimen Description BLOOD LEFT ANTECUBITAL  Final   Special Requests   Final    BOTTLES DRAWN AEROBIC AND ANAEROBIC Blood Culture results may not be optimal due to an inadequate volume of blood received in culture bottles   Culture   Final    NO GROWTH < 12 HOURS Performed at [facility], [place] [address], [place], [place] [address]    Report Status PENDING  Incomplete  Culture, blood (Routine X 2) w Reflex to ID Panel     Status: None (Preliminary result)   Collection Time: 12/04/19  9:53 PM   Specimen: BLOOD RIGHT HAND  Result Value Ref Range Status   Specimen Description BLOOD RIGHT HAND  Final   Special Requests   Final    BOTTLES DRAWN AEROBIC ONLY Blood Culture adequate volume   Culture   Final    NO GROWTH < 12 HOURS Performed at [facility], [place] [address] [place], [place] [address]    Report Status PENDING  Incomplete         Radiology Studies: MR BRAIN W WO CONTRAST  Result Date: 12/04/2019 CLINICAL DATA:  Delirium. EXAM: MRI HEAD WITHOUT AND WITH CONTRAST TECHNIQUE: Multiplanar, multiecho pulse sequences of the brain and surrounding structures were obtained without and with intravenous contrast. CONTRAST:  48mL GADAVIST GADOBUTROL 1 MMOL/ML IV SOLN COMPARISON:  MRI 12/01/2019 FINDINGS: Brain: No acute  infarction, hemorrhage, hydrocephalus, extra-axial collection or mass lesion. No abnormal enhancement. Vascular: Proximal arterial flow voids are maintained at the skull base. Skull and upper cervical spine: Normal marrow signal. Sinuses/Orbits: Mild ethmoid air cell mucosal thickening. No air-fluid levels. Cystic structures in the posterior nasopharynx with peripheral enhancement, likely benign  mucosal retention cysts. Other: No mastoid effusions. IMPRESSION: No evidence of acute intracranial abnormality. Electronically Signed   By: Margaretha Sheffield MD   On: 12/04/2019 10:25   Overnight EEG with video  Result Date: 12/05/2019 Lora Havens, MD     12/05/2019 10:02 AM Patient Name: JANCARLOS THRUN MRN: [id number] Epilepsy Attending: Lora Havens Referring Physician/Provider: Dr Derrick Ravel Duration: 12/04/2019 1215 to 12/05/2019 0955  Patient history: 54yo M with ams. EEG to evaluate for seizure.  Level of alertness: Awake  AEDs during EEG study: None  Technical aspects: This EEG study was done with scalp electrodes positioned according to the 10-20 International system of electrode placement. Electrical activity was acquired at a sampling rate of 500Hz  and reviewed with a high frequency filter of 70Hz  and a low frequency filter of 1Hz . EEG data were recorded continuously and digitally stored.  Description: The posterior dominant rhythm consists of 8-9 Hz activity of moderate voltage (25-35 uV) seen predominantly in posterior head regions, symmetric and reactive to eye opening and eye closing. Physiologic photic driving was not seen during photic stimulation.  Hyperventilation was not performed.    IMPRESSION: This study is within normal limits. No seizures or epileptiform discharges were seen throughout the recording.  Priyanka [name]    Scheduled Meds: . citalopram  20 mg Oral Daily   And  . citalopram  40 mg Oral QHS  . folic acid  1 mg Oral Daily  . hydrochlorothiazide  12.5 mg Oral Daily  . irbesartan  150 mg Oral Daily  . mometasone-formoterol  2 puff Inhalation BID  . multivitamin with minerals  1 tablet Oral Daily  . pantoprazole  40 mg Oral Daily  . simvastatin  20 mg Oral q1800   Continuous Infusions: . heparin 1,800 Units/hr (12/05/19 0625)     LOS: 3 days   Time spent: 73min  Jason C Navon Kotowski, DO [facility]  If 7PM-7AM, please contact  night-coverage [email or web address]  12/05/2019, 11:35 AM

## 2019-12-05 NOTE — Progress Notes (Signed)
Discontinued vLTM  No skin breakdown noted at [place]  FP2  F7  F8  FZ

## 2019-12-05 NOTE — Progress Notes (Addendum)
ANTICOAGULATION CONSULT NOTE - Initial Consult  Pharmacy Consult for Apixaban to Heparin IV Indication: h/o DVT, PE and clotting disorder  Allergies  Allergen Reactions  . Penicillins Other (See Comments)    Unknown childhood reaction  . Codeine Rash    Patient Measurements: Height: 6' (182.9 cm) Weight: 111.1 kg (245 lb) IBW/kg (Calculated) : 77.6 Heparin Dosing Weight: 111kg   Vital Signs: Temp: 98.1 F (36.7 C) (10/05 0431) Temp Source: Oral (10/05 0431) BP: 151/87 (10/05 0431) Pulse Rate: 63 (10/05 0431)  Labs: Recent Labs    12/04/19 1007 12/04/19 2148 12/05/19 0441 12/05/19 0616  HGB 15.2  --  13.9  --   HCT 46.6  --  43.7  --   PLT 210  --  201  --   APTT  --  55*  --  90*  HEPARINUNFRC  --  0.92*  --  0.82*  CREATININE 0.85  --  0.76  --     Estimated Creatinine Clearance: 135.9 mL/min (by C-G formula based on SCr of 0.76 mg/dL).   Medical History: Past Medical History:  Diagnosis Date  . Allergy   . Anxiety   . Clotting disorder ([place])   . Colon polyps   . COPD, severe ([place])   . Depression   . DVT (deep venous thrombosis) ([place])   . GERD (gastroesophageal reflux disease)   . H/O blood clots 04/2016  . History of anal fissures   . Hyperlipidemia   . Hypertension   . IBS (irritable bowel syndrome)   . Pulmonary embolism [facility])    Assessment: 54 yo male brought to [facility] after he was found down and unresponsive by his family. Patient on Apixaban 2.5mg  PO BID at home which was increased to 5mg  BID. Last dose 10/3 2212.  Neurology now wishes patient to be transitioned to IV heparin in anticipation of possible LP. Heparin will be delayed until after MRI to rule out head bleed.   Will need to use APTT until effect of DOAC on heparin level wears off. No acute hemorrhage noted on MRI, therefore will proceed with heparin drip.   APTT is 90 this AM. In range. HL slightly high,  so not correlating yet.   Goal of Therapy:  Heparin level 0.3-0.7  units/ml Monitor platelets by anticoagulation protocol: Yes  APTT 66-102   Plan:  Continue Heparin at 1800 units/hr, no bolus Confirmatory APTT in 6 hours Daily APTT, heparin level, and CBC Monitor for bleeding.   Leor Whyte A. Levada Dy, PharmD, BCPS, FNKF Clinical Pharmacist [place] Please utilize Amion for appropriate phone number to reach the unit pharmacist ([place])  Addendum: APTT 77 on repeat. Still in range. Will continue 1800 units/hr and get APTT/HL in AM.   Allyna Pittsley A. Levada Dy, PharmD, BCPS, FNKF Clinical Pharmacist [place] Please utilize Amion for appropriate phone number to reach the unit pharmacist ([place])     12/05/2019,7:25 AM

## 2019-12-05 NOTE — Care Management Important Message (Signed)
Important Message  Patient Details  Name: Jason Barajas MRN: [id number] Date of Birth: 1965-12-21   Medicare Important Message Given:  Yes     Orbie Pyo 12/05/2019, 4:33 PM

## 2019-12-05 NOTE — Progress Notes (Signed)
LTM maint complete - no skin breakdown under:   Fp1, F8, and M1. P8 fixed

## 2019-12-05 NOTE — Progress Notes (Signed)
Physical Therapy Treatment Patient Details Name: Jason Barajas MRN: [id number] DOB: [birth year] Today's Date: 12/05/2019    History of Present Illness Patient is a 54 year old male who presented to the hospital with AMS. He fell 2x on 12/01/19 and became unresponsive. CT and MRI of head were negaitive. Concern for benzo withrawal and acute metabolic encephalopathy. Medical hx of the following: clotting disorder, colon polyps, COPD, DVT, HTN, and pulmonary embolism.    PT Comments    Patient progressing well towards PT goals. Pt with improved alertness and mentation today. A&Ox3, does not recall what happened or why he is in the hospital. Able to follow simple commands with increased time and repetition and demonstrates impaired attention. Tolerated bed mobility and standing transfers with Min guard-Min A for balance/safety. Mobility limited due to pt being on continuous EEG but tolerated standing, marching in place and steps forward/backwards with close min guard. Sp02 remained >92% on RA during activity and HR up to 123 bpm esp with coughing. + productive cough with phlegm. Performed 5xSTS in 35.42 sec (normative is <11.6 seconds) indicating decreased functional strength, fall risk and impaired balance. Will continue to follow and progress as tolerated.    Follow Up Recommendations  No PT follow up;Supervision for mobility/OOB (likely none pending improvement)     Equipment Recommendations  None recommended by PT    Recommendations for Other Services       Precautions / Restrictions Precautions Precautions: Fall Precaution Comments: LTM EEG; foley catheter; seizure Restrictions Weight Bearing Restrictions: No    Mobility  Bed Mobility Overal bed mobility: Needs Assistance Bed Mobility: Supine to Sit;Sit to Supine     Supine to sit: Min guard;HOB elevated Sit to supine: Min guard;HOB elevated   General bed mobility comments: Increased time and use of rail to get to EOB. No  dizziness.  Transfers Overall transfer level: Needs assistance Equipment used: None Transfers: Sit to/from Stand Sit to Stand: Min assist;Min guard         General transfer comment: Min A to steady in standing; Stood from EOB x7 with posterior LOB and lean at times. Performed 5xSTS.  Ambulation/Gait Ambulation/Gait assistance: Min guard   Assistive device: None Gait Pattern/deviations: Wide base of support     General Gait Details: Able to perform marching in place and steps forwards/backwards away from bed, limited due to continuous EEG. Close Min guard. Productive cough with mobility.   Stairs             Wheelchair Mobility    Modified Rankin (Stroke Patients Only)       Balance Overall balance assessment: Needs assistance Sitting-balance support: Feet supported;No upper extremity supported Sitting balance-Leahy Scale: Good Sitting balance - Comments: Wife adjusting socks. Min guard-supervision for EOB, Sat EOB ~16 minutes.   Standing balance support: During functional activity Standing balance-Leahy Scale: Fair Standing balance comment: Close Min guard-Min A at times for dynamic tasks.                            Cognition Arousal/Alertness: Awake/alert Behavior During Therapy: WFL for tasks assessed/performed Overall Cognitive Status: Impaired/Different from baseline Area of Impairment: Orientation;Attention;Memory;Following commands;Problem solving                 Orientation Level: Disoriented to;Situation Current Attention Level: Sustained Memory: Decreased short-term memory Following Commands: Follows one step commands with increased time     Problem Solving: Slow processing;Requires verbal cues General Comments:  Requires repetition to follow commands and stay attended to task. Easily self distracted. Laughs excessively about something he saw on TV this morning with no relation to session.      Exercises      General  Comments General comments (skin integrity, edema, etc.): Wife present during session and reports he is acting more like himself today; sense of humor and joking around.  Performed 5xSTS in 35.42 sec (normative is <11.6 seconds) indicating decreased functional strength, fall risk and impaired balance.      Pertinent Vitals/Pain Pain Assessment: No/denies pain    Home Living                      Prior Function            PT Goals (current goals can now be found in the care plan section) Progress towards PT goals: Progressing toward goals    Frequency    Min 3X/week      PT Plan Current plan remains appropriate    Co-evaluation              AM-PAC PT "6 Clicks" Mobility   Outcome Measure  Help needed turning from your back to your side while in a flat bed without using bedrails?: A Little Help needed moving from lying on your back to sitting on the side of a flat bed without using bedrails?: A Little Help needed moving to and from a bed to a chair (including a wheelchair)?: A Little Help needed standing up from a chair using your arms (e.g., wheelchair or bedside chair)?: A Little Help needed to walk in hospital room?: A Little Help needed climbing 3-5 steps with a railing? : A Little 6 Click Score: 18    End of Session Equipment Utilized During Treatment: Gait belt (removed 02 for session and maintained >92% on RA) Activity Tolerance: Patient tolerated treatment well Patient left: in bed;with call bell/phone within reach;with family/visitor present Nurse Communication: Mobility status;Other (comment) (improvement in cognition) PT Visit Diagnosis: Unsteadiness on feet (R26.81);Muscle weakness (generalized) (M62.81);Difficulty in walking, not elsewhere classified (R26.2);Other symptoms and signs involving the nervous system (R29.898)     Time: 9622-2979 PT Time Calculation (min) (ACUTE ONLY): 26 min  Charges:  $Therapeutic Activity: 23-37 mins                      Marisa Severin, PT, DPT Acute Rehabilitation Services Pager [phone] Office [place] 12/05/2019, 11:30 AM

## 2019-12-05 NOTE — Progress Notes (Addendum)
STROKE TEAM PROGRESS NOTE   INTERVAL HISTORY Patient is alone resting in bed. He is talking today and speaks at length to the examiner- with confusion and perseveration noted. Improvement from yesterday when he was non verbal.   OBJECTIVE Vitals:   12/04/19 1932 12/04/19 2328 12/05/19 0431 12/05/19 0822  BP: (!) 145/97 (!) 163/91 (!) 151/87 (!) 152/96  Pulse: 85 77 63 82  Resp: 20 18 18 20   Temp: 98.7 F (37.1 C) 98.7 F (37.1 C) 98.1 F (36.7 C) 98.4 F (36.9 C)  TempSrc: Axillary Oral Oral Axillary  SpO2: 98% 97% 98% 96%  Weight:      Height:        CBC:  Recent Labs  Lab 12/01/19 1839 12/01/19 1840 12/04/19 1007 12/05/19 0441  WBC 8.3   < > 11.8* 8.3  NEUTROABS 4.8  --   --   --   HGB 14.7   < > 15.2 13.9  HCT 46.2   < > 46.6 43.7  MCV 98.3   < > 94.9 97.8  PLT 187   < > 210 201   < > = values in this interval not displayed.    Basic Metabolic Panel:  Recent Labs  Lab 12/04/19 1007 12/05/19 0441  NA 137 139  K 4.0 3.9  CL 102 103  CO2 20* 23  GLUCOSE 96 90  BUN 10 15  CREATININE 0.85 0.76  CALCIUM 8.9 8.7*    Lipid Panel:     Component Value Date/Time   CHOL 120 12/03/2019 0020   TRIG 63 12/03/2019 0020   HDL 31 (L) 12/03/2019 0020   CHOLHDL 3.9 12/03/2019 0020   VLDL 13 12/03/2019 0020   LDLCALC 76 12/03/2019 0020   HgbA1c:  Lab Results  Component Value Date   HGBA1C 5.5 12/03/2019   Urine Drug Screen:     Component Value Date/Time   LABOPIA NONE DETECTED 12/01/2019 1918   COCAINSCRNUR NONE DETECTED 12/01/2019 1918   LABBENZ POSITIVE (A) 12/01/2019 1918   AMPHETMU NONE DETECTED 12/01/2019 1918   THCU NONE DETECTED 12/01/2019 1918   LABBARB NONE DETECTED 12/01/2019 1918    Alcohol Level     Component Value Date/Time   ETH <10 12/01/2019 2024    CT Cervical Spine Wo Contrast Result Date: 12/01/2019 Mild degenerative changes of the cervical spine. No acute osseous abnormality.   MR BRAIN WO CONTRAST Result Date: 12/01/2019 No  restricted diffusion to suggest acute infarction. No midline shift, mass effect, evidence of mass lesion, ventriculomegaly, extra-axial collection or acute intracranial hemorrhage. Cervicomedullary junction and pituitary are within normal limits. Pearline Cables and white matter signal remains within normal limits throughout the brain. No convincing encephalomalacia. No chronic cerebral blood products. Vascular: Major intracranial vascular flow voids are stable since 2006. Skull and upper cervical spine: Negative. Sinuses/Orbits: Negative. Other: Mastoids are clear. Grossly negative visible internal auditory structures, scalp and face soft tissues. IMPRESSION: Normal noncontrast MRI appearance of the brain.   CT HEAD CODE STROKE WO CONTRAST Result Date: 12/01/2019 Cerebral volume is not significantly changed since 2006. No midline shift, ventriculomegaly, mass effect, evidence of mass lesion, intracranial hemorrhage or evidence of cortically based acute infarction. Gray-white matter differentiation is within normal limits throughout the brain. No cortical encephalomalacia identified. Vascular: Calcified atherosclerosis at the skull base. No suspicious intracranial vascular hyperdensity. Skull: Negative. Sinuses/Orbits: Visualized paranasal sinuses and mastoids are clear. Other: Visualized orbits and scalp soft tissues are within normal limits. ASPECTS [facility] Stroke Program Early CT Score)  Total score (0-10 with 10 being normal): 10 IMPRESSION: 1. Normal noncontrast CT appearance of the brain. ASPECTS 10. 2.   CT ANGIO HEAD AND NECK  Result Date: 12/01/2019 CTA NECK Skeleton: No acute osseous abnormality identified. Upper chest: Paraseptal emphysema in the medial left upper lobe. Atelectatic changes to the trachea. No superior mediastinal lymphadenopathy. Other neck: Reflux of venous contrast from the injected left subclavian into the paravertebral venous system greater on the left. No acute finding in the neck. Aortic  arch: 4 vessel arch configuration. The left vertebral arises directly from the arch. Soft plaque in the arch and at most of the great vessel origins. Right carotid system: Soft plaque at the brachiocephalic artery origin without significant stenosis. Normal right CCA. Negative right carotid bifurcation and cervical right ICA. Left carotid system: Soft plaque at the left CCA origin without stenosis. Mild plaque at the left ICA origin and bulb without stenosis. Vertebral arteries: Mild plaque in the proximal right subclavian artery without stenosis. Normal right vertebral artery origin. Right vertebral is patent and normal to the skull base. Left vertebral arises directly from the arch with minimal plaque at its origin. Patent left vertebral to the skull base with no plaque or stenosis identified.   CTA HEAD  Posterior circulation: Patent distal vertebral arteries and vertebrobasilar junction. The right V4 segment is mildly dominant. There is mild irregularity and stenosis in the distal left V4 segment. Normal right PICA origin. The left AICA may be dominant. Patent SCA and PCA origins. Posterior communicating arteries are diminutive or absent. Bilateral PCA branches are within normal limits. Anterior circulation: Both ICA siphons are patent. On the left there is mild plaque without stenosis. Minimal plaque on the right without stenosis. Patent carotid termini. Patent MCA and ACA origins. Mildly dominant left A1. Anterior communicating artery and bilateral ACA branches are within normal limits. Left MCA M1 segment and trifurcation are patent without stenosis. Left MCA branches are within normal limits. Right MCA M1 segment and bifurcation are patent without stenosis. Right MCA branches are within normal limits. Venous sinuses: Patent. Anatomic variants: Left vertebral artery arises directly from the arch and is mildly non dominant. Mildly dominant left ACA A1. Review of the MIP images confirms the above findings  IMPRESSION: 1. Negative for large vessel occlusion. 2. Generally mild atherosclerosis in the head and neck. No significant arterial stenosis identified. 3. Emphysema  Transthoracic Echocardiogram  Result date: 12/02/2019 1. Left ventricular ejection fraction, by estimation, is 60 to 65%. The  left ventricle has normal function. The left ventricle has no regional  wall motion abnormalities. There is moderate left ventricular hypertrophy.  Left ventricular diastolic  parameters were normal.  2. Right ventricular systolic function is normal. The right ventricular  size is normal.  3. The mitral valve is normal in structure. No evidence of mitral valve  regurgitation. No evidence of mitral stenosis.  4. The aortic valve has an indeterminant number of cusps. There is mild  calcification of the aortic valve. There is mild thickening of the aortic  valve. Aortic valve regurgitation is not visualized. No aortic stenosis is  present.  5. The inferior vena cava is normal in size with greater than 50%  respiratory variability, suggesting right atrial pressure of 3 mmHg.   EEG  Result date: 12/02/19  This study is within normal limits.No seizures or epileptiform discharges were seen throughout the recording.  Overnight EEG with Video done 12/04/19-12/05/19 This study is within normal limits. No seizures or epileptiform  discharges were seen throughout the recording.   PHYSICAL EXAM  Constitutional: Awake and alert resting in bed attentive to examiner.  MS: Oriented to person, place, year, date. Not oriented to situation. Follows commands.  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 He has a tremor that comes out when examining him to his right forearm.   Sensation: Intact to  light tough throughout  Coordination: Too confused to do FNF or HTS. When asked to do FNF he states that 2+2 =4 Gait: Deferred   ASSESSMENT/PLAN Mr. JONELL KRONTZ is a 54 y.o. male with history of DVT, PE, clotting disorder (Eliquis), COPD, tobacco use, HTN, and HLD brought to [facility] after he was found down and unresponsive by his family.Marland Kitchen He did not receive IV t-PA due to Eliquis therapy.   Seizure vs. Pre-syncope vs. Encephalopathy due to medication   Resultant fall at home x 2, AMS, non-focal, asterixis  CT Head - Normal noncontrast CT appearance of the brain. A  MRI head - Normal noncontrast MRI appearance of the brain.  CTA H&N - Negative for large vessel occlusion. Generally mild atherosclerosis in the head and neck.   2D Echo - EF 60-65%  12/02/19 EEG without seizure, EEG 10/4-10/5 with no seizure either   MRI Brain W WO Contrast with no evidence of acute intracranial abnormality   [employer] Virus 2 - negative  LDL - 76  HgbA1c - 5.5  UDS - benzo + (on home Xanax)  VTE prophylaxis - Eliquis 2.5mg  bid  Eliquis 2.5 bid prior to admission, now on Eliquis 5 bid. This was discontinued on 10/4 as a lumbar puncture will possibly be obtained in the near future. He was started on IV heparin in its place which has a short half life and can be paused prior to a lumbar puncture. LP to be done 12/06/19 given EEG and MRI have been unrevealing so far. Though he has improved he remains confused on exam as of 12/05/19  Do not feel long term AED is needed at this time.  Patient counseled to be compliant with his antithrombotic medications  Ongoing aggressive stroke risk factor management  Therapy recommendations:  OT recommends CIR   Disposition:  Pending, he is not medically ready to discharge at this time   ?? Benzo withdraw  Fidget, distractible, decreased attention concentration  Takes heavy Xanax dose at home regularly for the last several years  UDS positive for  benzo  Put on clonazepam 1 mg twice daily  On celexa home dose  Close monitoring  Hx of DVT/PE  04/2016 admitted in [facility] for PE.  Put on Eliquis 5 mg twice daily on discharge  Per pharmacy, in 2019, patient had rectal bleeding, Eliquis down to 2.5 twice daily  Discussed with pharmacy, put on Eliquis 5 mg twice daily ( stopped on 12/04/19 due to possible LP can resume after it is decided if LP is needed or not, in the interim he is on heparin for anticoagulation)   Close monitoring bleeding  Hypertension  Home BP meds: irbesartan ; HCTZ  Current BP meds: olmasartan ; HCTZ  Stable - mildly low at times . Long-term BP goal normotensive  Hyperlipidemia  Home Lipid lowering medication: Zocor 20 mg daily  LDL - 76, goal < 70  Resumed zocor 20  Continue statin at discharge  Tobacco abuse  Current smoker  Smoking cessation counseling provided  Pt is willing to quit  Other Stroke Risk Factor  Previous ETOH   Obesity, recommend weight loss, diet and exercise as appropriate   Other Active Problems  Code status - Full code   Hospital day # [place], NP  [facility] Nurse Practitioner  12/05/2019 8:37 AM  ATTENDING NOTE: I reviewed above note and agree with the assessment and plan. Pt was seen and examined.   Wife at bedside.  Patient sitting at edge of the bed having lunch.  He is awake alert, fluent of speech although with mild slow talking, however orientated x3.  Follows simple commands.  No focal deficit, no nuchal rigidity.  No fever.  Per wife, apparently, patient back to baseline or at least very near to his baseline.  Wife told me that she checked patient medication at home, found the recently prescribed carisoprodol has only several pills left. She concerns that pt could be taking too much of muscle relaxant. LTM EEG no seizure.   From the pt presentation during the hospitalization as well negative work up so far, I do think pt AMS  likely due to medication effect, i.e. polypharmacy. I recommend wife further supervise pt medication and encourage pt and wife to discuss with PCP for medication management.   Will d/c LTM EEG. Blood culture  Pending. Do not think LP needed now, will transition heparin IV back to eliquis.   Neurology will sign off. Please call with questions. No neuro follow up needed at this time. Thanks for the consult.  Rosalin Hawking, MD PhD Stroke Neurology 12/05/2019 12:17 PM   To contact Stroke Continuity provider, please refer to [email or web address] After hours, contact General Neurology

## 2019-12-05 NOTE — Progress Notes (Signed)
ANTICOAGULATION CONSULT NOTE - Initial Consult  Pharmacy Consult for Apixaban  Indication: h/o DVT, PE and clotting disorder  Allergies  Allergen Reactions   Penicillins Other (See Comments)    Unknown childhood reaction   Codeine Rash    Patient Measurements: Height: 6' (182.9 cm) Weight: 111.1 kg (245 lb) IBW/kg (Calculated) : 77.6 Heparin Dosing Weight: 111kg   Vital Signs: Temp: 98 F (36.7 C) (10/05 1126) Temp Source: Oral (10/05 1126) BP: 147/90 (10/05 1126) Pulse Rate: 63 (10/05 1126)  Labs: Recent Labs    12/04/19 1007 12/04/19 2148 12/05/19 0441 12/05/19 0616 12/05/19 1302  HGB 15.2  --  13.9  --   --   HCT 46.6  --  43.7  --   --   PLT 210  --  201  --   --   APTT  --  55*  --  90* 77*  HEPARINUNFRC  --  0.92*  --  0.82*  --   CREATININE 0.85  --  0.76  --   --     Estimated Creatinine Clearance: 135.9 mL/min (by C-G formula based on SCr of 0.76 mg/dL).   Medical History: Past Medical History:  Diagnosis Date   Allergy    Anxiety    Clotting disorder (HCC)    Colon polyps    COPD, severe ([place])    Depression    DVT (deep venous thrombosis) (HCC)    GERD (gastroesophageal reflux disease)    H/O blood clots 04/2016   History of anal fissures    Hyperlipidemia    Hypertension    IBS (irritable bowel syndrome)    Pulmonary embolism [facility])    Assessment: 54 yo male brought to [facility] after he was found down and unresponsive by his family. Patient on Apixaban 2.5mg  PO BID at home which was increased to 5mg  BID. Last dose 10/3 2212.  Neurology now wishes patient to be transitioned to IV heparin in anticipation of possible LP. Heparin will be delayed until after MRI to rule out head bleed.   Patient's LP has been canceled per Dr. Erlinda Hong. MD wishes to resume apixaban.   Goal of Therapy:  Heparin level 0.3-0.7 units/ml Monitor platelets by anticoagulation protocol: Yes  APTT 66-102   Plan:  D/C Heparin drip Give apixaban 5mg  BID with  first dose at same time as drip turned off.   Monitor for bleeding.   Arshdeep Bolger A. Levada Dy, PharmD, BCPS, FNKF Clinical Pharmacist [place] Please utilize Amion for appropriate phone number to reach the unit pharmacist ([place])       12/05/2019,2:27 PM

## 2019-12-05 NOTE — Procedures (Addendum)
Patient Name: RYLEN SWINDLER  MRN: [id number]  Epilepsy Attending: Lora Havens  Referring Physician/Provider: Dr Derrick Ravel Duration: 12/04/2019 1215 to 12/05/2019 0955  Patient history: 54yo M with ams. EEG to evaluate for seizure.   Level of alertness: Awake  AEDs during EEG study: None  Technical aspects: This EEG study was done with scalp electrodes positioned according to the 10-20 International system of electrode placement. Electrical activity was acquired at a sampling rate of 500Hz  and reviewed with a high frequency filter of 70Hz  and a low frequency filter of 1Hz . EEG data were recorded continuously and digitally stored.   Description: The posterior dominant rhythm consists of 8-9 Hz activity of moderate voltage (25-35 uV) seen predominantly in posterior head regions, symmetric and reactive to eye opening and eye closing. Physiologic photic driving was not seen during photic stimulation.  Hyperventilation was not performed.     IMPRESSION: This study is within normal limits. No seizures or epileptiform discharges were seen throughout the recording.  [place] Thresher [name]

## 2019-12-06 DIAGNOSIS — F32A Depression, unspecified: Secondary | ICD-10-CM | POA: Diagnosis not present

## 2019-12-06 DIAGNOSIS — W19XXXA Unspecified fall, initial encounter: Secondary | ICD-10-CM | POA: Diagnosis not present

## 2019-12-06 DIAGNOSIS — R401 Stupor: Secondary | ICD-10-CM | POA: Diagnosis not present

## 2019-12-06 DIAGNOSIS — J42 Unspecified chronic bronchitis: Secondary | ICD-10-CM | POA: Diagnosis not present

## 2019-12-06 LAB — BASIC METABOLIC PANEL
Anion gap: 14 (ref 5–15)
BUN: 13 mg/dL (ref 6–20)
CO2: 23 mmol/L (ref 22–32)
Calcium: 8.9 mg/dL (ref 8.9–10.3)
Chloride: 99 mmol/L (ref 98–111)
Creatinine, Ser: 0.73 mg/dL (ref 0.61–1.24)
GFR calc non Af Amer: >60 mL/min (ref >60)
Glucose, Bld: 89 mg/dL (ref 70–99)
Potassium: 4.5 mmol/L (ref 3.5–5.1)
Sodium: 136 mmol/L (ref 135–145)

## 2019-12-06 LAB — CBC
HCT: 43.7 % (ref 39.0–52.0)
Hemoglobin: 14.3 g/dL (ref 13.0–17.0)
MCH: 31.1 pg (ref 26.0–34.0)
MCHC: 32.7 g/dL (ref 30.0–36.0)
MCV: 95 fL (ref 80.0–100.0)
Platelets: 201 10*3/uL (ref 150–400)
RBC: 4.6 MIL/uL (ref 4.22–5.81)
RDW: 13 % (ref 11.5–15.5)
WBC: 9.1 10*3/uL (ref 4.0–10.5)
nRBC: 0 % (ref 0.0–0.2)

## 2019-12-06 NOTE — Progress Notes (Signed)
PROGRESS NOTE    Jason Barajas  LDJ:[id number] DOB: [birth year] DOA: 12/01/2019 PCP: Ronita Hipps, MD   Brief Narrative:  Jason Barajas is a 54 y.o. male with medical history significant of anxiety, depression, COPD, PE/DVT on Eliquis, GERD, hypertension, hyperlipidemia, IBS presenting to the ED with altered mental status. Patient is very somnolent and no history could be obtained from him. History provided by wife at bedside who states that yesterday patient had an unwitnessed fall. Then again today he had another unwitnessed fall. States this evening patient was acting normal and talking to her and another neighbor. Then all of a sudden his speech became slurred and he became unresponsive.  His eyes were still open and he did not lose consciousness.  He takes Xanax 1 mg up to 5 times a day for anxiety.  He also takes gabapentin for panic attacks.  States she fill out his pillbox and keeps the rest of the medication with her. She is not sure how much Xanax he took today. Wife states patient has depression for which he takes citalopram and is seen by a psychiatrist. States patient has not expressed any suicidal thoughts. States he has chronic shortness of breath due to COPD but has not had any fevers, cough, nausea, vomiting, or diarrhea.  He has not complained of any abdominal pain.  Wife confirms that he does not drink any alcohol.  He has been vaccinated against Covid. In ED: Initially code stroke was activated and patient was seen by neurology.  TPA not given as he is on Eliquis.  Head CT normal.  CT angiogram head and neck negative for LVO.  Brain MRI normal. WBC 8.3, hemoglobin 14.7, hematocrit 46.2, platelet 187K.  Sodium 136, potassium 3.9, chloride 101, bicarb 23, BUN 8, creatinine 0.7, glucose 78.  LFTs normal.  INR 1.1.  UA not suggestive of infection.  UDS positive for benzodiazepines (patient takes Xanax at home).  SARS-CoV-2 PCR test negative.  Influenza panel negative.  Blood ethanol  level undetectable.  Salicylate and acetaminophen levels pending.  Chest x-ray negative for acute cardiopulmonary abnormality.  CT C-spine negative for acute osseous abnormality.  EKG without acute changes. ABG with pH 7.33, PCO2 54, PO2 60.  He was placed on BiPAP briefly which did not improve his mental status. Patient received 1 L IV fluid bolus.   Assessment & Plan:   Principal Problem:   AMS (altered mental status) Active Problems:   COPD (chronic obstructive pulmonary disease) (HCC)   Fall   History of pulmonary embolism   Depression  Acute metabolic encephalopathy, likely multifactorial, improving Likely polypharmacy with subsequent withdrawal No acute intracranial process noted -Patient's mental status continues to be labile but markedly improving, this morning patient is awake alert oriented to person place and general situation, he has difficulty with holding prior events but at this point is much more clear and articulate than previously -Continue CIWA protocol, likely benzo withdrawal given improvement with supportive care, he does have mild resting tremor in the right hand today worse with intention -Neurology following, appreciate insight and recommendations, no further indication for LP, restart Eliquis - CT head neck MRI without acute findings/process at intake, repeat MRI unremarkable -Initial EEG negative for seizure-like activity, repeat EEG also unremarkable  Unwitnessed falls, prior to admission:  -Likely secondary to above, PT evaluating much more stable now with mental status improvement. -Continue cardiac monitoring. Echocardiogram unremarkable.   - PT/OT evaluation ongoing, likely disposition home with home health per previous  documentation  Anxiety, depression -Continue home citalopram, will attempt to wean off of benzos while in-house -Consider Librium taper at discharge if still scoring on CIWA protocol or needing ongoing doses  COPD, stable:  -No acute  exacerbation, given minimally elevated CO2 at admission at 30 and only mildly acidotic - would presume his CO2 baseline is around 50  History of PE/DVT -Continue Eliquis  DVT prophylaxis:  Continue Eliquis, heparin drip discontinued given no further need for LP Code Status: Full code Family Communication: Wife at bedside  Status is: Inpatient  Dispo: The patient is from: Home              Anticipated d/c is to: To be determined              Anticipated d/c date is: 48-72 hours pending clinical course and further findings              Patient currently not medically stable for discharge given ongoing mental status changes from baseline but generally improving.  Consultants:   Neurology  Procedures:   EEG x2, echocardiogram  Antimicrobials:  Not indicated  Subjective: Patient had poor sleep cycle overnight, up every few hours per staff but otherwise uneventful.  Denies nausea, vomiting, diarrhea, constipation, headache, fevers, chills this morning, patient much more awake alert oriented as above, not yet back to baseline per wife but markedly improving.  Objective: Vitals:   12/05/19 1618 12/05/19 2025 12/06/19 0317 12/06/19 0754  BP: (!) 148/91 (!) 146/81 (!) 151/96 (!) 145/98  Pulse: 81 89 94 75  Resp:  20 18   Temp: 98 F (36.7 C) 98.7 F (37.1 C) 98.6 F (37 C) 98.9 F (37.2 C)  TempSrc: Oral Oral Oral Oral  SpO2:  98% 100% 94%  Weight:      Height:        Intake/Output Summary (Last 24 hours) at 12/06/2019 0810 Last data filed at 12/06/2019 0005 Gross per 24 hour  Intake 270 ml  Output 300 ml  Net -30 ml   Filed Weights   12/04/19 1120  Weight: 111.1 kg    Examination:  General:  Pleasantly resting in bed, No acute distress.  Much more awake alert oriented to person place and general situation this morning HEENT:  Normocephalic atraumatic.  Sclerae nonicteric, noninjected.  Extraocular movements intact bilaterally. Neck:  Without mass or deformity.   Trachea is midline. Lungs:  Clear to auscultate bilaterally without rhonchi, wheeze, or rales. Heart:  Regular rate and rhythm.  Without murmurs, rubs, or gallops. Abdomen:  Soft, nontender, nondistended.  Without guarding or rebound. Extremities: Without cyanosis, clubbing, edema, or obvious deformity. Vascular:  Dorsalis pedis and posterior tibial pulses palpable bilaterally. Skin:  Warm and dry, no erythema, no ulcerations.   Data Reviewed: I have personally reviewed following labs and imaging studies  CBC: Recent Labs  Lab 12/01/19 1839 12/01/19 1840 12/01/19 2020 12/02/19 0218 12/04/19 1007 12/05/19 0441 12/06/19 0354  WBC 8.3  --   --   --  11.8* 8.3 9.1  NEUTROABS 4.8  --   --   --   --   --   --   HGB 14.7   < > 14.3 13.9 15.2 13.9 14.3  HCT 46.2   < > 42.0 41.0 46.6 43.7 43.7  MCV 98.3  --   --   --  94.9 97.8 95.0  PLT 187  --   --   --  210 201 201   < > =  values in this interval not displayed.   Basic Metabolic Panel: Recent Labs  Lab 12/01/19 1839 12/01/19 1839 12/01/19 1840 12/01/19 1840 12/01/19 2020 12/02/19 0218 12/04/19 1007 12/05/19 0441 12/06/19 0354  NA 136   < > 137   < > 137 136 137 139 136  K 3.9   < > 3.8   < > 3.8 3.8 4.0 3.9 4.5  CL 101  --  99  --   --   --  102 103 99  CO2 23  --   --   --   --   --  20* 23 23  GLUCOSE 78  --  76  --   --   --  96 90 89  BUN 8  --  9  --   --   --  10 15 13   CREATININE 0.77  --  0.80  --   --   --  0.85 0.76 0.73  CALCIUM 8.5*  --   --   --   --   --  8.9 8.7* 8.9   < > = values in this interval not displayed.   GFR: Estimated Creatinine Clearance: 135.9 mL/min (by C-G formula based on SCr of 0.73 mg/dL). Liver Function Tests: Recent Labs  Lab 12/01/19 1839 12/04/19 1007  AST 15 20  ALT 15 16  ALKPHOS 54 70  BILITOT 0.8 1.4*  PROT 6.1* 6.8  ALBUMIN 3.5 3.5   No results for input(s): LIPASE, AMYLASE in the last 168 hours. Recent Labs  Lab 12/02/19 0046 12/04/19 1541  AMMONIA 46* 26    Coagulation Profile: Recent Labs  Lab 12/01/19 1839  INR 1.1   Cardiac Enzymes: Recent Labs  Lab 12/02/19 0157  CKTOTAL 71   BNP (last 3 results) No results for input(s): PROBNP in the last 8760 hours. HbA1C: No results for input(s): HGBA1C in the last 72 hours. CBG: Recent Labs  Lab 12/01/19 1833 12/04/19 0809  GLUCAP 76 88   Lipid Profile: No results for input(s): CHOL, HDL, LDLCALC, TRIG, CHOLHDL, LDLDIRECT in the last 72 hours. Thyroid Function Tests: No results for input(s): TSH, T4TOTAL, FREET4, T3FREE, THYROIDAB in the last 72 hours. Anemia Panel: No results for input(s): VITAMINB12, FOLATE, FERRITIN, TIBC, IRON, RETICCTPCT in the last 72 hours. Sepsis Labs: No results for input(s): PROCALCITON, LATICACIDVEN in the last 168 hours.  Recent Results (from the past 240 hour(s))  Respiratory Panel by RT PCR (Flu A&B, Covid) - Nasopharyngeal Swab     Status: None   Collection Time: 12/01/19  7:28 PM   Specimen: Nasopharyngeal Swab  Result Value Ref Range Status   SARS Coronavirus 2 by RT PCR NEGATIVE NEGATIVE Final    Comment: (NOTE) SARS-CoV-2 target nucleic acids are NOT DETECTED.  The SARS-CoV-2 RNA is generally detectable in upper respiratoy specimens during the acute phase of infection. The lowest concentration of SARS-CoV-2 viral copies this assay can detect is 131 copies/mL. A negative result does not preclude SARS-Cov-2 infection and should not be used as the sole basis for treatment or other patient management decisions. A negative result may occur with  improper specimen collection/handling, submission of specimen other than nasopharyngeal swab, presence of viral mutation(s) within the areas targeted by this assay, and inadequate number of viral copies (<131 copies/mL). A negative result must be combined with clinical observations, patient history, and epidemiological information. The expected result is Negative.  Fact Sheet for Patients:   PinkCheek.be  Fact Sheet for Healthcare Providers:  GravelBags.it  This test is no t yet approved or cleared by the [place] and  has been authorized for detection and/or diagnosis of SARS-CoV-2 by FDA under an Emergency Use Authorization (EUA). This EUA will remain  in effect (meaning this test can be used) for the duration of the COVID-19 declaration under Section 564(b)(1) of the Act, 21 U.S.C. section 360bbb-3(b)(1), unless the authorization is terminated or revoked sooner.     Influenza A by PCR NEGATIVE NEGATIVE Final   Influenza B by PCR NEGATIVE NEGATIVE Final    Comment: (NOTE) The Xpert Xpress SARS-CoV-2/FLU/RSV assay is intended as an aid in  the diagnosis of influenza from Nasopharyngeal swab specimens and  should not be used as a sole basis for treatment. Nasal washings and  aspirates are unacceptable for Xpert Xpress SARS-CoV-2/FLU/RSV  testing.  Fact Sheet for Patients: PinkCheek.be  Fact Sheet for Healthcare Providers: GravelBags.it  This test is not yet approved or cleared by the [place] FDA and  has been authorized for detection and/or diagnosis of SARS-CoV-2 by  FDA under an Emergency Use Authorization (EUA). This EUA will remain  in effect (meaning this test can be used) for the duration of the  Covid-19 declaration under Section 564(b)(1) of the Act, 21  U.S.C. section 360bbb-3(b)(1), unless the authorization is  terminated or revoked. Performed at [facility], [place] [address], [place], [place] [address]   Culture, blood (Routine X 2) w Reflex to ID Panel     Status: None (Preliminary result)   Collection Time: 12/04/19  9:48 PM   Specimen: BLOOD  Result Value Ref Range Status   Specimen Description BLOOD LEFT ANTECUBITAL  Final   Special Requests   Final    BOTTLES DRAWN AEROBIC AND ANAEROBIC Blood Culture results  may not be optimal due to an inadequate volume of blood received in culture bottles   Culture   Final    NO GROWTH 2 DAYS Performed at [facility], [place] [address], [place], [place] [address]    Report Status PENDING  Incomplete  Culture, blood (Routine X 2) w Reflex to ID Panel     Status: None (Preliminary result)   Collection Time: 12/04/19  9:53 PM   Specimen: BLOOD RIGHT HAND  Result Value Ref Range Status   Specimen Description BLOOD RIGHT HAND  Final   Special Requests   Final    BOTTLES DRAWN AEROBIC ONLY Blood Culture adequate volume   Culture   Final    NO GROWTH 2 DAYS Performed at [facility], [place] [address] [place], [place] [address]    Report Status PENDING  Incomplete         Radiology Studies: MR BRAIN W WO CONTRAST  Result Date: 12/04/2019 CLINICAL DATA:  Delirium. EXAM: MRI HEAD WITHOUT AND WITH CONTRAST TECHNIQUE: Multiplanar, multiecho pulse sequences of the brain and surrounding structures were obtained without and with intravenous contrast. CONTRAST:  84mL GADAVIST GADOBUTROL 1 MMOL/ML IV SOLN COMPARISON:  MRI 12/01/2019 FINDINGS: Brain: No acute infarction, hemorrhage, hydrocephalus, extra-axial collection or mass lesion. No abnormal enhancement. Vascular: Proximal arterial flow voids are maintained at the skull base. Skull and upper cervical spine: Normal marrow signal. Sinuses/Orbits: Mild ethmoid air cell mucosal thickening. No air-fluid levels. Cystic structures in the posterior nasopharynx with peripheral enhancement, likely benign mucosal retention cysts. Other: No mastoid effusions. IMPRESSION: No evidence of acute intracranial abnormality. Electronically Signed   By: Margaretha Sheffield MD   On: 12/04/2019 10:25  Overnight EEG with video  Result Date: 12/05/2019 Lora Havens, MD     12/05/2019 10:02 AM Patient Name: DONAVIN AUDINO MRN: [id number] Epilepsy Attending: Lora Havens Referring Physician/Provider: Dr Derrick Ravel  Duration: 12/04/2019 1215 to 12/05/2019 0955  Patient history: 54yo M with ams. EEG to evaluate for seizure.  Level of alertness: Awake  AEDs during EEG study: None  Technical aspects: This EEG study was done with scalp electrodes positioned according to the 10-20 International system of electrode placement. Electrical activity was acquired at a sampling rate of 500Hz  and reviewed with a high frequency filter of 70Hz  and a low frequency filter of 1Hz . EEG data were recorded continuously and digitally stored.  Description: The posterior dominant rhythm consists of 8-9 Hz activity of moderate voltage (25-35 uV) seen predominantly in posterior head regions, symmetric and reactive to eye opening and eye closing. Physiologic photic driving was not seen during photic stimulation.  Hyperventilation was not performed.    IMPRESSION: This study is within normal limits. No seizures or epileptiform discharges were seen throughout the recording.  Priyanka [name]    Scheduled Meds: . apixaban  5 mg Oral BID  . citalopram  20 mg Oral Daily   And  . citalopram  40 mg Oral QHS  . folic acid  1 mg Oral Daily  . hydrochlorothiazide  12.5 mg Oral Daily  . irbesartan  150 mg Oral Daily  . mometasone-formoterol  2 puff Inhalation BID  . multivitamin with minerals  1 tablet Oral Daily  . pantoprazole  40 mg Oral Daily  . simvastatin  20 mg Oral q1800   Continuous Infusions:    LOS: 4 days   Time spent: 19min  Aedan C Shervon Kerwin, DO [facility]  If 7PM-7AM, please contact night-coverage [email or web address]  12/06/2019, 8:10 AM

## 2019-12-07 DIAGNOSIS — F32A Depression, unspecified: Secondary | ICD-10-CM | POA: Diagnosis not present

## 2019-12-07 DIAGNOSIS — R401 Stupor: Secondary | ICD-10-CM | POA: Diagnosis not present

## 2019-12-07 DIAGNOSIS — W19XXXA Unspecified fall, initial encounter: Secondary | ICD-10-CM | POA: Diagnosis not present

## 2019-12-07 DIAGNOSIS — J42 Unspecified chronic bronchitis: Secondary | ICD-10-CM | POA: Diagnosis not present

## 2019-12-07 LAB — CBC
HCT: 44.8 % (ref 39.0–52.0)
Hemoglobin: 14.7 g/dL (ref 13.0–17.0)
MCH: 31.1 pg (ref 26.0–34.0)
MCHC: 32.8 g/dL (ref 30.0–36.0)
MCV: 94.9 fL (ref 80.0–100.0)
Platelets: 224 10*3/uL (ref 150–400)
RBC: 4.72 MIL/uL (ref 4.22–5.81)
RDW: 12.9 % (ref 11.5–15.5)
WBC: 8.3 10*3/uL (ref 4.0–10.5)
nRBC: 0 % (ref 0.0–0.2)

## 2019-12-07 LAB — BASIC METABOLIC PANEL
Anion gap: 13 (ref 5–15)
BUN: 11 mg/dL (ref 6–20)
CO2: 25 mmol/L (ref 22–32)
Calcium: 9.1 mg/dL (ref 8.9–10.3)
Chloride: 98 mmol/L (ref 98–111)
Creatinine, Ser: 0.7 mg/dL (ref 0.61–1.24)
GFR calc non Af Amer: >60 mL/min (ref >60)
Glucose, Bld: 99 mg/dL (ref 70–99)
Potassium: 3.6 mmol/L (ref 3.5–5.1)
Sodium: 136 mmol/L (ref 135–145)

## 2019-12-07 NOTE — Progress Notes (Signed)
Physical Therapy Treatment Patient Details Name: Jason Barajas MRN: [id number] DOB: 13-Jul-1965 Today's Date: 12/07/2019    History of Present Illness Patient is a 54 year old male who presented to the hospital with AMS. He fell 2x on 12/01/19 and became unresponsive. CT and MRI of head were negaitive. Concern for benzo withrawal and acute metabolic encephalopathy. Medical hx of the following: clotting disorder, colon polyps, COPD, DVT, HTN, and pulmonary embolism.    PT Comments    Pt very confused this date, describing a young boy having been in the room that was also sick with a monitor and the pt continued to perseverate on this character throughout session with pt being concerned that music was bothering the child. Displayed shakiness in UEs, but no LOB with gait. Attempted to have bowel movement on toilet, without success. Ambulated at slow pace with wide stance and poor feet clearance with RW 20 ft bouts this date, with no trunk sway or LOB noted. Required step-by-step repeated directions to remain on task and perform functional mobility safely. Due to his altered mental state affecting his safety with mobility and his decreased functional status compared to PLOF a SNF is being recommended upon hospital d/c at this time. Will continue to follow-up with acute PT services to address his deficits in endurance, strength, and balance to maximize his safety and independence with functional mobility. Will continue to monitor pt for changes in status consistency for d/c recommendations.    Follow Up Recommendations  SNF;Supervision/Assistance - 24 hour     Equipment Recommendations  Rolling walker with 5" wheels    Recommendations for Other Services       Precautions / Restrictions Precautions Precautions: Fall Precaution Comments: foley catheter Restrictions Weight Bearing Restrictions: No    Mobility  Bed Mobility Overal bed mobility: Needs Assistance Bed Mobility: Supine to Sit      Supine to sit: Min guard     General bed mobility comments: Increased time and HOB elevated with supine > sit R EOB, requiring extra time and cues for safety.  Transfers Overall transfer level: Needs assistance Equipment used: Rolling walker (2 wheeled) Transfers: Sit to/from Stand Sit to Stand: Min guard         General transfer comment: Extra time and VC's for hand placement with transfers. Unsteadiness noted upon initially coming to stand, requiring min guard for safety.   Ambulation/Gait Ambulation/Gait assistance: Min guard Gait Distance (Feet): 20 Feet (x2 bous with seated rest break between bouts) Assistive device: Rolling walker (2 wheeled) Gait Pattern/deviations: Wide base of support;Shuffle (noted shaky hands) Gait velocity: decr Gait velocity interpretation: <1.31 ft/sec, indicative of household ambulator General Gait Details: Requires cues to maintain hand placement on RW. Bumped 1 obstacle with RW with correction and extra time. No LOB, but shakiness noted in UEs and slow gait speed.   Stairs             Wheelchair Mobility    Modified Rankin (Stroke Patients Only)       Balance Overall balance assessment: Needs assistance Sitting-balance support: Feet supported;No upper extremity supported Sitting balance-Leahy Scale: Good Sitting balance - Comments: Supervision for sitting unsupported EOB with no unsteadiness or LOB noted.   Standing balance support: Single extremity supported;Bilateral upper extremity supported;During functional activity Standing balance-Leahy Scale: Fair Standing balance comment: Close min guard during static and dynamic standing with pt intermittently reaching off BOS minimally with 1 UE while other UE on RW. No LOB, but shakiness noted in UEs  and pt required cues to maintain hand contact with RW rather than trying to pick up objects.                            Cognition Arousal/Alertness: Awake/alert Behavior  During Therapy: WFL for tasks assessed/performed Overall Cognitive Status: Impaired/Different from baseline Area of Impairment: Orientation;Attention;Memory;Following commands;Safety/judgement;Awareness                 Orientation Level: Disoriented to;Situation (unable to state day of week or of month) Current Attention Level: Sustained (requires cues to remain on task) Memory: Decreased short-term memory (mentioned being in hospital then he was going to the pool) Following Commands: Follows one step commands with increased time;Follows multi-step commands inconsistently;Follows multi-step commands with increased time Safety/Judgement: Decreased awareness of safety;Decreased awareness of deficits Awareness: Intellectual Problem Solving: Slow processing;Difficulty sequencing;Requires verbal cues;Requires tactile cues General Comments: Pt aware of location intermittently and confused with interaction with other people. Requires repetitive cues to remain attentive to task and reminders to have UE support on RW for safety.       Exercises      General Comments        Pertinent Vitals/Pain Pain Assessment: No/denies pain Pain Intervention(s): Monitored during session    Home Living                      Prior Function            PT Goals (current goals can now be found in the care plan section) Acute Rehab PT Goals Patient Stated Goal: wants to go home PT Goal Formulation: With patient Time For Goal Achievement: 12/18/19 Potential to Achieve Goals: Good Progress towards PT goals: Progressing toward goals    Frequency    Min 3X/week      PT Plan Discharge plan needs to be updated;Equipment recommendations need to be updated    Co-evaluation              AM-PAC PT "6 Clicks" Mobility   Outcome Measure  Help needed turning from your back to your side while in a flat bed without using bedrails?: A Little Help needed moving from lying on your back to  sitting on the side of a flat bed without using bedrails?: A Little Help needed moving to and from a bed to a chair (including a wheelchair)?: A Little Help needed standing up from a chair using your arms (e.g., wheelchair or bedside chair)?: A Little Help needed to walk in hospital room?: A Little Help needed climbing 3-5 steps with a railing? : A Little 6 Click Score: 18    End of Session Equipment Utilized During Treatment: Gait belt Activity Tolerance: Patient tolerated treatment well Patient left: in chair;with call bell/phone within reach;with chair alarm set Nurse Communication: Mobility status (current cognitive status) PT Visit Diagnosis: Unsteadiness on feet (R26.81);Muscle weakness (generalized) (M62.81);Difficulty in walking, not elsewhere classified (R26.2);Other symptoms and signs involving the nervous system (R29.898);Other abnormalities of gait and mobility (R26.89)     Time: 4492-0100 PT Time Calculation (min) (ACUTE ONLY): 40 min  Charges:  $Gait Training: 23-37 mins $Therapeutic Activity: 8-22 mins                     Moishe Spice, PT, DPT Acute Rehabilitation Services  Pager: [phone] Office: [place] 12/07/2019, 1:57 PM

## 2019-12-07 NOTE — Progress Notes (Signed)
PROGRESS NOTE    Jason Barajas  NAT:[id number] DOB: [birth year] DOA: 12/01/2019 PCP: Ronita Hipps, MD   Brief Narrative:  Jason Barajas is a 54 y.o. male with medical history significant of anxiety, depression, COPD, PE/DVT on Eliquis, GERD, hypertension, hyperlipidemia, IBS presenting to the ED with altered mental status. Patient is very somnolent and no history could be obtained from him. History provided by wife at bedside who states that yesterday patient had an unwitnessed fall. Then again today he had another unwitnessed fall. States this evening patient was acting normal and talking to her and another neighbor. Then all of a sudden his speech became slurred and he became unresponsive.  His eyes were still open and he did not lose consciousness.  He takes Xanax 1 mg up to 5 times a day for anxiety.  He also takes gabapentin for panic attacks.  States she fill out his pillbox and keeps the rest of the medication with her. She is not sure how much Xanax he took today. Wife states patient has depression for which he takes citalopram and is seen by a psychiatrist. States patient has not expressed any suicidal thoughts. States he has chronic shortness of breath due to COPD but has not had any fevers, cough, nausea, vomiting, or diarrhea.  He has not complained of any abdominal pain.  Wife confirms that he does not drink any alcohol.  He has been vaccinated against Covid. In ED: Initially code stroke was activated and patient was seen by neurology.  TPA not given as he is on Eliquis.  Head CT normal.  CT angiogram head and neck negative for LVO.  Brain MRI normal. WBC 8.3, hemoglobin 14.7, hematocrit 46.2, platelet 187K.  Sodium 136, potassium 3.9, chloride 101, bicarb 23, BUN 8, creatinine 0.7, glucose 78.  LFTs normal.  INR 1.1.  UA not suggestive of infection.  UDS positive for benzodiazepines (patient takes Xanax at home).  SARS-CoV-2 PCR test negative.  Influenza panel negative.  Blood ethanol  level undetectable.  Salicylate and acetaminophen levels pending.  Chest x-ray negative for acute cardiopulmonary abnormality.  CT C-spine negative for acute osseous abnormality.  EKG without acute changes. ABG with pH 7.33, PCO2 54, PO2 60.  He was placed on BiPAP briefly which did not improve his mental status. Patient received 1 L IV fluid bolus.   Assessment & Plan:   Principal Problem:   AMS (altered mental status) Active Problems:   COPD (chronic obstructive pulmonary disease) (HCC)   Fall   History of pulmonary embolism   Depression   Acute metabolic encephalopathy, likely multifactorial, markedly labile Likely polypharmacy with subsequent withdrawal No acute intracranial process noted -Patient's mental status continues to be labile over the past 24 hours, he has not received benzodiazepines for quite some time, he did receive Haldol overnight for agitation, unclear why patient received Haldol and not benzos per CIWA protocol -Continue CIWA protocol, likely benzo withdrawal given improvement with supportive care, he does have mild resting tremor in the right hand today worse with intention -Neurology following, appreciate insight and recommendations, no further indication for LP, continue Eliquis - CT head neck MRI without acute findings/process at intake, repeat MRI unremarkable -Initial EEG negative for seizure-like activity, repeat EEG also unremarkable  Unwitnessed falls, prior to admission:  -Likely secondary to above, PT evaluating much more stable now with mental status improvement. -Continue cardiac monitoring. Echocardiogram unremarkable.   - PT/OT evaluation ongoing, likely disposition home with home health per previous documentation  Anxiety, depression -Continue home citalopram, will attempt to wean off of benzos while in-house -Consider Librium taper at discharge if still scoring on CIWA protocol or needing ongoing doses  COPD, stable:  -No acute exacerbation,  given minimally elevated CO2 at admission at 28 and only mildly acidotic - would presume his CO2 baseline is around 50  History of PE/DVT -Continue Eliquis  DVT prophylaxis:  Continue Eliquis, heparin drip discontinued given no further need for LP Code Status: Full code Family Communication: Wife at bedside  Status is: Inpatient  Dispo: The patient is from: Home              Anticipated d/c is to: To be determined              Anticipated d/c date is: 48-72 hours pending clinical course and further findings              Patient currently not medically stable for discharge given ongoing mental status changes from baseline but generally improving.  Consultants:   Neurology  Procedures:   EEG x2, echocardiogram  Antimicrobials:  Not indicated  Subjective: Overnight patient was somewhat combative, so much so that he received IM Haldol, this morning patient continues to be somewhat confused, unclear if secondary to Haldol or ongoing benzodiazepine withdrawals.  Patient's IV Ativan was discontinued in preference for p.o. route, unfortunately patient has not received p.o. Ativan since it was initiated.  Patient review of systems today is markedly limited given his altered mental status and inability to form coherent thought, alert only to person at this point.  Objective: Vitals:   12/06/19 0754 12/06/19 1219 12/06/19 1929 12/07/19 0744  BP: (!) 145/98   128/64  Pulse: 75  99 81  Resp:   14 20  Temp: 98.9 F (37.2 C) 98.5 F (36.9 C)  98.3 F (36.8 C)  TempSrc: Oral Oral  Oral  SpO2: 94%  95% 100%  Weight:      Height:        Intake/Output Summary (Last 24 hours) at 12/07/2019 0831 Last data filed at 12/06/2019 2100 Gross per 24 hour  Intake 270 ml  Output --  Net 270 ml   Filed Weights   12/04/19 1120  Weight: 111.1 kg    Examination:  General: Patient walking with physical therapy, difficult to redirect, requiring multiple reminders to move his feet, hold the  walker and to stop picking at IV, telemetry, catheter.  He is alert and oriented to person only HEENT:  Normocephalic atraumatic.  Sclerae nonicteric, noninjected.  Extraocular movements intact bilaterally. Neck:  Without mass or deformity.  Trachea is midline. Lungs:  Clear to auscultate bilaterally without rhonchi, wheeze, or rales. Heart:  Regular rate and rhythm.  Without murmurs, rubs, or gallops. Abdomen:  Soft, nontender, nondistended.  Without guarding or rebound. Extremities: Without cyanosis, clubbing, edema, or obvious deformity. Vascular:  Dorsalis pedis and posterior tibial pulses palpable bilaterally. Skin:  Warm and dry, no erythema, no ulcerations.   Data Reviewed: I have personally reviewed following labs and imaging studies  CBC: Recent Labs  Lab 12/01/19 1839 12/01/19 1840 12/02/19 0218 12/04/19 1007 12/05/19 0441 12/06/19 0354 12/07/19 0519  WBC 8.3  --   --  11.8* 8.3 9.1 8.3  NEUTROABS 4.8  --   --   --   --   --   --   HGB 14.7   < > 13.9 15.2 13.9 14.3 14.7  HCT 46.2   < > 41.0 46.6  43.7 43.7 44.8  MCV 98.3  --   --  94.9 97.8 95.0 94.9  PLT 187  --   --  210 201 201 224   < > = values in this interval not displayed.   Basic Metabolic Panel: Recent Labs  Lab 12/01/19 1839 12/01/19 1839 12/01/19 1840 12/01/19 2020 12/02/19 0218 12/04/19 1007 12/05/19 0441 12/06/19 0354 12/07/19 0519  NA 136   < > 137   < > 136 137 139 136 136  K 3.9   < > 3.8   < > 3.8 4.0 3.9 4.5 3.6  CL 101   < > 99  --   --  102 103 99 98  CO2 23  --   --   --   --  20* 23 23 25   GLUCOSE 78   < > 76  --   --  96 90 89 99  BUN 8   < > 9  --   --  10 15 13 11   CREATININE 0.77   < > 0.80  --   --  0.85 0.76 0.73 0.70  CALCIUM 8.5*  --   --   --   --  8.9 8.7* 8.9 9.1   < > = values in this interval not displayed.   GFR: Estimated Creatinine Clearance: 135.9 mL/min (by C-G formula based on SCr of 0.7 mg/dL). Liver Function Tests: Recent Labs  Lab 12/01/19 1839  12/04/19 1007  AST 15 20  ALT 15 16  ALKPHOS 54 70  BILITOT 0.8 1.4*  PROT 6.1* 6.8  ALBUMIN 3.5 3.5   No results for input(s): LIPASE, AMYLASE in the last 168 hours. Recent Labs  Lab 12/02/19 0046 12/04/19 1541  AMMONIA 46* 26   Coagulation Profile: Recent Labs  Lab 12/01/19 1839  INR 1.1   Cardiac Enzymes: Recent Labs  Lab 12/02/19 0157  CKTOTAL 71   BNP (last 3 results) No results for input(s): PROBNP in the last 8760 hours. HbA1C: No results for input(s): HGBA1C in the last 72 hours. CBG: Recent Labs  Lab 12/01/19 1833 12/04/19 0809  GLUCAP 76 88   Lipid Profile: No results for input(s): CHOL, HDL, LDLCALC, TRIG, CHOLHDL, LDLDIRECT in the last 72 hours. Thyroid Function Tests: No results for input(s): TSH, T4TOTAL, FREET4, T3FREE, THYROIDAB in the last 72 hours. Anemia Panel: No results for input(s): VITAMINB12, FOLATE, FERRITIN, TIBC, IRON, RETICCTPCT in the last 72 hours. Sepsis Labs: No results for input(s): PROCALCITON, LATICACIDVEN in the last 168 hours.  Recent Results (from the past 240 hour(s))  Respiratory Panel by RT PCR (Flu A&B, Covid) - Nasopharyngeal Swab     Status: None   Collection Time: 12/01/19  7:28 PM   Specimen: Nasopharyngeal Swab  Result Value Ref Range Status   SARS Coronavirus 2 by RT PCR NEGATIVE NEGATIVE Final    Comment: (NOTE) SARS-CoV-2 target nucleic acids are NOT DETECTED.  The SARS-CoV-2 RNA is generally detectable in upper respiratoy specimens during the acute phase of infection. The lowest concentration of SARS-CoV-2 viral copies this assay can detect is 131 copies/mL. A negative result does not preclude SARS-Cov-2 infection and should not be used as the sole basis for treatment or other patient management decisions. A negative result may occur with  improper specimen collection/handling, submission of specimen other than nasopharyngeal swab, presence of viral mutation(s) within the areas targeted by this assay,  and inadequate number of viral copies (<131 copies/mL). A negative result must be combined with clinical  observations, patient history, and epidemiological information. The expected result is Negative.  Fact Sheet for Patients:  PinkCheek.be  Fact Sheet for Healthcare Providers:  GravelBags.it  This test is no t yet approved or cleared by the [place] FDA and  has been authorized for detection and/or diagnosis of SARS-CoV-2 by FDA under an Emergency Use Authorization (EUA). This EUA will remain  in effect (meaning this test can be used) for the duration of the COVID-19 declaration under Section 564(b)(1) of the Act, 21 U.S.C. section 360bbb-3(b)(1), unless the authorization is terminated or revoked sooner.     Influenza A by PCR NEGATIVE NEGATIVE Final   Influenza B by PCR NEGATIVE NEGATIVE Final    Comment: (NOTE) The Xpert Xpress SARS-CoV-2/FLU/RSV assay is intended as an aid in  the diagnosis of influenza from Nasopharyngeal swab specimens and  should not be used as a sole basis for treatment. Nasal washings and  aspirates are unacceptable for Xpert Xpress SARS-CoV-2/FLU/RSV  testing.  Fact Sheet for Patients: PinkCheek.be  Fact Sheet for Healthcare Providers: GravelBags.it  This test is not yet approved or cleared by the [place] FDA and  has been authorized for detection and/or diagnosis of SARS-CoV-2 by  FDA under an Emergency Use Authorization (EUA). This EUA will remain  in effect (meaning this test can be used) for the duration of the  Covid-19 declaration under Section 564(b)(1) of the Act, 21  U.S.C. section 360bbb-3(b)(1), unless the authorization is  terminated or revoked. Performed at [facility], [place] [address], [place], [place] [address]   Culture, blood (Routine X 2) w Reflex to ID Panel     Status: None (Preliminary result)    Collection Time: 12/04/19  9:48 PM   Specimen: BLOOD  Result Value Ref Range Status   Specimen Description BLOOD LEFT ANTECUBITAL  Final   Special Requests   Final    BOTTLES DRAWN AEROBIC AND ANAEROBIC Blood Culture results may not be optimal due to an inadequate volume of blood received in culture bottles   Culture   Final    NO GROWTH 2 DAYS Performed at [facility], [place] [address], [place], [place] [address]    Report Status PENDING  Incomplete  Culture, blood (Routine X 2) w Reflex to ID Panel     Status: None (Preliminary result)   Collection Time: 12/04/19  9:53 PM   Specimen: BLOOD RIGHT HAND  Result Value Ref Range Status   Specimen Description BLOOD RIGHT HAND  Final   Special Requests   Final    BOTTLES DRAWN AEROBIC ONLY Blood Culture adequate volume   Culture   Final    NO GROWTH 2 DAYS Performed at [facility], [place] [address] [place], [place] [address]    Report Status PENDING  Incomplete         Radiology Studies: Overnight EEG with video  Result Date: 12/05/2019 Lora Havens, MD     12/05/2019 10:02 AM Patient Name: Jason Barajas MRN: [id number] Epilepsy Attending: Lora Havens Referring Physician/Provider: Dr Derrick Ravel Duration: 12/04/2019 1215 to 12/05/2019 0955  Patient history: 54yo M with ams. EEG to evaluate for seizure.  Level of alertness: Awake  AEDs during EEG study: None  Technical aspects: This EEG study was done with scalp electrodes positioned according to the 10-20 International system of electrode placement. Electrical activity was acquired at a sampling rate of 500Hz  and reviewed with a high frequency filter of 70Hz  and a  low frequency filter of 1Hz . EEG data were recorded continuously and digitally stored.  Description: The posterior dominant rhythm consists of 8-9 Hz activity of moderate voltage (25-35 uV) seen predominantly in posterior head regions, symmetric and reactive to eye opening and eye closing.  Physiologic photic driving was not seen during photic stimulation.  Hyperventilation was not performed.    IMPRESSION: This study is within normal limits. No seizures or epileptiform discharges were seen throughout the recording.  Priyanka [name]    Scheduled Meds:  apixaban  5 mg Oral BID   citalopram  20 mg Oral Daily   And   citalopram  40 mg Oral QHS   folic acid  1 mg Oral Daily   hydrochlorothiazide  12.5 mg Oral Daily   irbesartan  150 mg Oral Daily   mometasone-formoterol  2 puff Inhalation BID   multivitamin with minerals  1 tablet Oral Daily   pantoprazole  40 mg Oral Daily   simvastatin  20 mg Oral q1800   Continuous Infusions:    LOS: 5 days   Time spent: 86min  Cutler C Noriah Osgood, DO [facility]  If 7PM-7AM, please contact night-coverage [email or web address]  12/07/2019, 8:31 AM

## 2019-12-07 NOTE — Progress Notes (Signed)
Occupational Therapy Treatment Patient Details Name: Jason Barajas MRN: [id number] DOB: 15-Jan-1966 Today's Date: 12/07/2019    History of present illness Patient is a 54 year old male who presented to the hospital with AMS. He fell 2x on 12/01/19 and became unresponsive. CT and MRI of head were negaitive. Concern for benzo withrawal and acute metabolic encephalopathy. Medical hx of the following: clotting disorder, colon polyps, COPD, DVT, HTN, and pulmonary embolism.   OT comments  Pt seen for additional session at request of RN staff for assist back to bed. Pt continuing to present with difficulty following commands, which impacts his physical ability. With max A +2 and +3 person close by for safety pt able to complete stand pivot transfer from chair back to bed. Pt with difficulty problem solving basic mobility tasks throughout session. Continues to be verbalizing nonsensically and distracted, requiring total A +2 to return supine. Spoke with wife about d/c plans, pt status, and provided education on helping orient patient. Will continue to follow.   Follow Up Recommendations  SNF    Equipment Recommendations  None recommended by OT    Recommendations for Other Services      Precautions / Restrictions Precautions Precautions: Fall Precaution Comments: foley catheter Restrictions Weight Bearing Restrictions: No       Mobility Bed Mobility Overal bed mobility: Needs Assistance Bed Mobility: Sit to Supine     Supine to sit: Min guard Sit to supine: Total assist;+2 for physical assistance;+2 for safety/equipment   General bed mobility comments: total A +2 for initation and safe sequencing of transfer. Pt very internally distracted and nonsencially verbalizing  Transfers Overall transfer level: Needs assistance Equipment used: 2 person hand held assist Transfers: Sit to/from Stand Sit to Stand: Max assist;+2 physical assistance;+2 safety/equipment         General  transfer comment: max A +2 with +3 person (NT) close by for safety as well. Max-total initation cues needed    Balance Overall balance assessment: Needs assistance Sitting-balance support: Feet supported;No upper extremity supported Sitting balance-Leahy Scale: Good Sitting balance - Comments: Supervision for sitting unsupported EOB with no unsteadiness or LOB noted.   Standing balance support: Single extremity supported;Bilateral upper extremity supported;During functional activity Standing balance-Leahy Scale: Fair Standing balance comment: Close min guard during static and dynamic standing with pt intermittently reaching off BOS minimally with 1 UE while other UE on RW. No LOB, but shakiness noted in UEs and pt required cues to maintain hand contact with RW rather than trying to pick up objects.                           ADL either performed or assessed with clinical judgement   ADL Overall ADL's : Needs assistance/impaired                                       General ADL Comments: Pt completed simulated toilet transfer with max A +2 and multimodal cues to initiate transfer. Pt slow to recieve instruction without significan tactile cueing     Vision   Additional Comments: kepy eyes closed in session   Perception     Praxis      Cognition Arousal/Alertness: Awake/alert Behavior During Therapy: Restless Overall Cognitive Status: Impaired/Different from baseline Area of Impairment: Orientation;Attention;Memory;Following commands;Safety/judgement;Awareness  Orientation Level: Disoriented to;Place;Time;Situation Current Attention Level: Focused Memory: Decreased short-term memory Following Commands: Follows one step commands inconsistently Safety/Judgement: Decreased awareness of safety;Decreased awareness of deficits Awareness: Intellectual Problem Solving: Slow processing;Difficulty sequencing;Requires verbal cues;Requires  tactile cues;Decreased initiation General Comments: Pt was able to engage somewhat in sit <> stand and stand pivot transfer back to bed with max-total problem solving cues. Pt continuing to speak nonsensically. Wife present and reports that this is worse from previous date        Exercises     Shoulder Instructions       General Comments      Pertinent Vitals/ Pain       Pain Assessment: No/denies pain Pain Intervention(s): Monitored during session  Home Living                                          Prior Functioning/Environment              Frequency  Min 2X/week        Progress Toward Goals  OT Goals(current goals can now be found in the care plan section)  Progress towards OT goals: Progressing toward goals  Acute Rehab OT Goals Patient Stated Goal: wants to go home OT Goal Formulation: With family Time For Goal Achievement: 12/18/19 Potential to Achieve Goals: Good  Plan Discharge plan remains appropriate    Co-evaluation                 AM-PAC OT "6 Clicks" Daily Activity     Outcome Measure   Help from another person eating meals?: Total Help from another person taking care of personal grooming?: Total Help from another person toileting, which includes using toliet, bedpan, or urinal?: A Lot Help from another person bathing (including washing, rinsing, drying)?: Total Help from another person to put on and taking off regular upper body clothing?: Total Help from another person to put on and taking off regular lower body clothing?: Total 6 Click Score: 7    End of Session    OT Visit Diagnosis: Unsteadiness on feet (R26.81);Muscle weakness (generalized) (M62.81);Other symptoms and signs involving cognitive function   Activity Tolerance Treatment limited secondary to agitation   Patient Left in bed;with call bell/phone within reach;with bed alarm set;with family/visitor present   Nurse Communication Mobility status         Time: 4709-2957 OT Time Calculation (min): 13 min  Charges: OT General Charges $OT Visit: 1 Visit OT Treatments $Self Care/Home Management : 8-22 mins $Therapeutic Activity: 8-22 mins  Zenovia Jarred, MSOT, OTR/L Acute Rehabilitation Services [facility] Office Number: [phone] Pager: [phone]  Zenovia Jarred 12/07/2019, 5:12 PM

## 2019-12-07 NOTE — Progress Notes (Signed)
Occupational Therapy Treatment Patient Details Name: Jason Barajas MRN: [id number] DOB: Jul 18, 1965 Today's Date: 12/07/2019    History of present illness Patient is a 54 year old male who presented to the hospital with AMS. He fell 2x on 12/01/19 and became unresponsive. CT and MRI of head were negaitive. Concern for benzo withrawal and acute metabolic encephalopathy. Medical hx of the following: clotting disorder, colon polyps, COPD, DVT, HTN, and pulmonary embolism.   OT comments  Pt seen for OT follow up session with focus on cognition and ADL engagement. Pt up in chair at start of session, keeping his eyes closed and talking in nonsensical phrases. Throughout entire session, pt not able to follow any purposeful commands. He was very fixated on a family member dying, a double barrel shotgun, and holding the figurative shot gun (hands mimicking the formation) throughout session. Despite several approaches and attempts, pt fighting mobility with posterior lean and resisting OT. MD and CM notified of SNF being more appropriate recommendation for safety and ADL progression. Will continue to follow.   Follow Up Recommendations  SNF    Equipment Recommendations  None recommended by OT    Recommendations for Other Services      Precautions / Restrictions Precautions Precautions: Fall Precaution Comments: foley catheter Restrictions Weight Bearing Restrictions: No       Mobility Bed Mobility Overal bed mobility: Needs Assistance Bed Mobility: Supine to Sit     Supine to sit: Min guard     General bed mobility comments: pt up in chair  Transfers Overall transfer level: Needs assistance Equipment used: Rolling walker (2 wheeled) Transfers: Sit to/from Stand Sit to Stand: Min guard         General transfer comment: attempted to have pt stand from recliner, was resisting therapist with posterior lean and not purposefully initiating mobility. OT then deferred to protect  safety    Balance Overall balance assessment: Needs assistance Sitting-balance support: Feet supported;No upper extremity supported Sitting balance-Leahy Scale: Good Sitting balance - Comments: Supervision for sitting unsupported EOB with no unsteadiness or LOB noted.   Standing balance support: Single extremity supported;Bilateral upper extremity supported;During functional activity Standing balance-Leahy Scale: Fair Standing balance comment: Close min guard during static and dynamic standing with pt intermittently reaching off BOS minimally with 1 UE while other UE on RW. No LOB, but shakiness noted in UEs and pt required cues to maintain hand contact with RW rather than trying to pick up objects.                           ADL either performed or assessed with clinical judgement   ADL Overall ADL's : Needs assistance/impaired                                       General ADL Comments: Pt total A for all ADLs at this time due to cognitive status. Pt not purposefully following commands     Vision   Additional Comments: kept eyes closed throughout session   Perception     Praxis      Cognition Arousal/Alertness: Awake/alert Behavior During Therapy: WFL for tasks assessed/performed Overall Cognitive Status: Impaired/Different from baseline Area of Impairment: Orientation;Attention;Memory;Following commands;Safety/judgement;Awareness                 Orientation Level: Disoriented to;Place;Time;Situation Current Attention Level: Focused Memory: Decreased short-term  memory Following Commands: Follows one step commands inconsistently Safety/Judgement: Decreased awareness of safety;Decreased awareness of deficits Awareness: Intellectual Problem Solving: Slow processing;Difficulty sequencing;Requires verbal cues;Requires tactile cues;Decreased initiation General Comments: Pt not purposefully following any commands, talking nonsensically about  having a double barrell shot gun to shoot someone (had hand gestures of non existent shot gun "held" at OT) that had killed his family. then becoming tearful then beginning to talk nonseniscally agagin. No purposeful commands followed this session.        Exercises     Shoulder Instructions       General Comments      Pertinent Vitals/ Pain       Pain Assessment: No/denies pain Pain Intervention(s): Monitored during session  Home Living                                          Prior Functioning/Environment              Frequency  Min 2X/week        Progress Toward Goals  OT Goals(current goals can now be found in the care plan section)  Progress towards OT goals: Not progressing toward goals - comment (not following commands this session)  Acute Rehab OT Goals Patient Stated Goal: wants to go home OT Goal Formulation: With family Time For Goal Achievement: 12/18/19 Potential to Achieve Goals: Good  Plan Discharge plan needs to be updated    Co-evaluation                 AM-PAC OT "6 Clicks" Daily Activity     Outcome Measure   Help from another person eating meals?: Total Help from another person taking care of personal grooming?: Total Help from another person toileting, which includes using toliet, bedpan, or urinal?: Total Help from another person bathing (including washing, rinsing, drying)?: Total Help from another person to put on and taking off regular upper body clothing?: Total Help from another person to put on and taking off regular lower body clothing?: Total 6 Click Score: 6    End of Session    OT Visit Diagnosis: Unsteadiness on feet (R26.81);Muscle weakness (generalized) (M62.81);Other symptoms and signs involving cognitive function   Activity Tolerance Treatment limited secondary to agitation   Patient Left in chair;with call bell/phone within reach;with chair alarm set   Nurse Communication Mobility status         Time: 7026-3785 OT Time Calculation (min): 18 min  Charges: OT General Charges $OT Visit: 1 Visit OT Treatments $Self Care/Home Management : 8-22 mins  Zenovia Jarred, MSOT, OTR/L [facility] Office Number: [phone] Pager: [phone]  Zenovia Jarred 12/07/2019, 5:00 PM

## 2019-12-08 DIAGNOSIS — J42 Unspecified chronic bronchitis: Secondary | ICD-10-CM | POA: Diagnosis not present

## 2019-12-08 DIAGNOSIS — F32A Depression, unspecified: Secondary | ICD-10-CM | POA: Diagnosis not present

## 2019-12-08 DIAGNOSIS — W19XXXA Unspecified fall, initial encounter: Secondary | ICD-10-CM | POA: Diagnosis not present

## 2019-12-08 DIAGNOSIS — R401 Stupor: Secondary | ICD-10-CM | POA: Diagnosis not present

## 2019-12-08 MED ORDER — CHLORDIAZEPOXIDE HCL 5 MG PO CAPS
10.0000 mg | ORAL_CAPSULE | Freq: Four times a day (QID) | ORAL | Status: DC
  Administered 2019-12-10 – 2019-12-12 (×9): 10 mg via ORAL
  Filled 2019-12-08 (×9): qty 2

## 2019-12-08 MED ORDER — CHLORDIAZEPOXIDE HCL 5 MG PO CAPS: 5.0000 mg | ORAL_CAPSULE | Freq: Four times a day (QID) | ORAL | Status: DC

## 2019-12-08 MED ORDER — LORAZEPAM 1 MG PO TABS
1.0000 mg | ORAL_TABLET | ORAL | Status: AC | PRN
  Administered 2019-12-08: 2 mg via ORAL
  Administered 2019-12-09: 4 mg via ORAL
  Administered 2019-12-09: 1 mg via ORAL
  Administered 2019-12-10: 2 mg via ORAL
  Filled 2019-12-08: qty 2
  Filled 2019-12-08: qty 1
  Filled 2019-12-08: qty 2
  Filled 2019-12-08: qty 4

## 2019-12-08 MED ORDER — SODIUM CHLORIDE 0.9% FLUSH
3.0000 mL | Freq: Two times a day (BID) | INTRAVENOUS | Status: DC
  Administered 2019-12-08 – 2019-12-12 (×8): 3 mL via INTRAVENOUS

## 2019-12-08 MED ORDER — CHLORDIAZEPOXIDE HCL 25 MG PO CAPS
25.0000 mg | ORAL_CAPSULE | Freq: Four times a day (QID) | ORAL | Status: AC
  Administered 2019-12-08 – 2019-12-09 (×6): 25 mg via ORAL
  Filled 2019-12-08 (×6): qty 1

## 2019-12-08 NOTE — Progress Notes (Signed)
PT Cancellation Note  Patient Details Name: Jason Barajas MRN: [id number] DOB: 01/23/1966   Cancelled Treatment:    Reason Eval/Treat Not Completed: Fatigue/lethargy limiting ability to participate Pt given 2 doses of ativan this AM, holding PT session as pt sleeping hard. Will follow up.   Jason Barajas 12/08/2019, 11:06 AM Marisa Severin, PT, DPT Acute Rehabilitation Services Pager [phone] Office [phone]

## 2019-12-08 NOTE — Progress Notes (Signed)
Re: Dvid, Pendry DOB: [birth year] Date: 12/08/2019  To Whom It May Concern:  Please be advised the above-named patient will require a short term nursing home stay--anticipated 30 days or less for rehabilitation and strengthening. The plan is for return home.

## 2019-12-08 NOTE — TOC Initial Note (Signed)
Transition of Care [facility]) - Initial/Assessment Note    Patient Details  Name: Jason Barajas MRN: [id number] Date of Birth: 03-06-1965  Transition of Care [facility]) CM/SW Contact:    Pollie Friar, RN Phone Number: 12/08/2019, 3:54 PM  Clinical Narrative:                 CM met with the patient and his spouse in the room. Currently the wife is in agreement with SNF in [facility]. She is hoping the patient will progress to home when ready for d/c. She is agreeable to having him faxed out. Will need bed offers when available.  PASAR went to manual review. TOC following.  Expected Discharge Plan: Skilled Nursing Facility Barriers to Discharge: Continued Medical Work up   Patient Goals and CMS Choice   CMS Medicare.gov Compare Post Acute Care list provided to:: Patient Represenative (must comment) Choice offered to / list presented to : Spouse  Expected Discharge Plan and Services Expected Discharge Plan: [place] In-house Referral: Clinical Social Work Discharge Planning Services: CM Consult Post Acute Care Choice: [place] arrangements for the past 2 months: [place]                                      Prior Living Arrangements/Services Living arrangements for the past 2 months: Single Family Home Lives with:: Spouse Patient language and need for interpreter reviewed:: Yes Do you feel safe going back to the place where you live?: Yes      Need for Family Participation in Patient Care: Yes (Comment) Care giver support system in place?: No (comment)   Criminal Activity/Legal Involvement Pertinent to Current Situation/Hospitalization: No - Comment as needed  Activities of Daily Living      Permission Sought/Granted                  Emotional Assessment Appearance:: Appears stated age Attitude/Demeanor/Rapport: Engaged Affect (typically observed): Accepting Orientation: : Oriented to Self, Oriented to  Place, Oriented to Situation   Psych Involvement: No (comment)  Admission diagnosis:  Altered mental status, unspecified altered mental status type [R41.82] AMS (altered mental status) [R41.82] Patient Active Problem List   Diagnosis Date Noted  . Fall 12/02/2019  . History of pulmonary embolism 12/02/2019  . Depression 12/02/2019  . AMS (altered mental status) 12/01/2019  . SMOKER 05/01/2008  . PULMONARY NODULE 05/01/2008  . COPD (chronic obstructive pulmonary disease) ([place]) 03/16/2008   PCP:  Ronita Hipps, MD Pharmacy:   [facility] [place] [address] Phone: [phone] Fax: [phone]     Social Determinants of Health (SDOH) Interventions    Readmission Risk Interventions No flowsheet data found.

## 2019-12-08 NOTE — Progress Notes (Addendum)
ANTICOAGULATION CONSULT NOTE - Initial Consult  Pharmacy Consult for Apixaban  Indication: h/o DVT, PE and clotting disorder  Allergies  Allergen Reactions  . Penicillins Other (See Comments)    Unknown childhood reaction  . Codeine Rash    Patient Measurements: Height: 6' (182.9 cm) Weight: 111.1 kg (245 lb) IBW/kg (Calculated) : 77.6 Heparin Dosing Weight: 111kg   Vital Signs: Temp: 98 F (36.7 C) (10/08 0432) Temp Source: Oral (10/08 0432) BP: 138/85 (10/08 0432) Pulse Rate: 103 (10/08 0432)  Labs: Recent Labs    12/05/19 1302 12/06/19 0354 12/07/19 0519  HGB  --  14.3 14.7  HCT  --  43.7 44.8  PLT  --  201 224  APTT 77*  --   --   CREATININE  --  0.73 0.70    Estimated Creatinine Clearance: 135.9 mL/min (by C-G formula based on SCr of 0.7 mg/dL).   Medical History: Past Medical History:  Diagnosis Date  . Allergy   . Anxiety   . Clotting disorder ([place])   . Colon polyps   . COPD, severe ([place])   . Depression   . DVT (deep venous thrombosis) ([place])   . GERD (gastroesophageal reflux disease)   . H/O blood clots 04/2016  . History of anal fissures   . Hyperlipidemia   . Hypertension   . IBS (irritable bowel syndrome)   . Pulmonary embolism [facility])    Assessment: 54 yo male brought to [facility] after he was found down and unresponsive by his family. Patient on Apixaban 2.5mg  PO BID at home which was increased to 5mg  BID.   Patient later transitioned to IV heparin in anticipation of possible LP.    Patient's LP was been canceled per Dr. Erlinda Hong. MD who ordered patient to resume apixaban.   No bleeding noted, CBC stable  Goal of Therapy:  Monitor platelets by anticoagulation protocol: Yes     Plan:  Continue increased dose of apixaban 5mg  PO BID Monitor for bleeding Pharmacy will sign off and follow peripherally.  Jozelynn Danielson A. Levada Dy, PharmD, BCPS, FNKF Clinical Pharmacist [place] Please utilize Amion for appropriate phone number to reach the unit  pharmacist ([place])       12/08/2019,7:59 AM

## 2019-12-08 NOTE — Discharge Instructions (Signed)

## 2019-12-08 NOTE — Progress Notes (Signed)
PROGRESS NOTE    Jason Barajas  ZOX:[id number] DOB: 11-25-1965 DOA: 12/01/2019 PCP: Ronita Hipps, MD   Brief Narrative:  ADARIAN BUR is a 54 y.o. male with medical history significant of anxiety, depression, COPD, PE/DVT on Eliquis, GERD, hypertension, hyperlipidemia, IBS presenting to the ED with altered mental status. Patient is very somnolent and no history could be obtained from him. History provided by wife at bedside who states that yesterday patient had an unwitnessed fall. Then again today he had another unwitnessed fall. States this evening patient was acting normal and talking to her and another neighbor. Then all of a sudden his speech became slurred and he became unresponsive.  His eyes were still open and he did not lose consciousness.  He takes Xanax 1 mg up to 5 times a day for anxiety.  He also takes gabapentin for panic attacks.  States she fill out his pillbox and keeps the rest of the medication with her. She is not sure how much Xanax he took today. Wife states patient has depression for which he takes citalopram and is seen by a psychiatrist. States patient has not expressed any suicidal thoughts. States he has chronic shortness of breath due to COPD but has not had any fevers, cough, nausea, vomiting, or diarrhea.  He has not complained of any abdominal pain.  Wife confirms that he does not drink any alcohol.  He has been vaccinated against Covid. In ED: Initially code stroke was activated and patient was seen by neurology.  TPA not given as he is on Eliquis.  Head CT normal.  CT angiogram head and neck negative for LVO.  Brain MRI normal. WBC 8.3, hemoglobin 14.7, hematocrit 46.2, platelet 187K.  Sodium 136, potassium 3.9, chloride 101, bicarb 23, BUN 8, creatinine 0.7, glucose 78.  LFTs normal.  INR 1.1.  UA not suggestive of infection.  UDS positive for benzodiazepines (patient takes Xanax at home).  SARS-CoV-2 PCR test negative.  Influenza panel negative.  Blood ethanol  level undetectable.  Salicylate and acetaminophen levels pending.  Chest x-ray negative for acute cardiopulmonary abnormality.  CT C-spine negative for acute osseous abnormality.  EKG without acute changes. ABG with pH 7.33, PCO2 54, PO2 60.  He was placed on BiPAP briefly which did not improve his mental status. Patient received 1 L IV fluid bolus. Hospitalist called to admit.  Patient admitted as above initially concern to be a stroke rule out subsequently noted to be polypharmacy in the setting of Xanax and Soma.  Patient was given flumazenil at intake with minimal improvement likely in the setting of Soma ingestion.  Patient's home medications were held at intake appropriately due to concern for polypharmacy, MRI and EEG initially negative.  Patient's mental status did not improve over the next 48 hours concerning for withdrawal symptoms as such patient was placed on low-dose benzos with moderate improvement in mental status.  Unfortunately over the next 48 hours patient would have moderate swelling and mental status with repeat MRI and EEG unremarkable.  At this point we are attempting to titrate patient's benzodiazepine dose given concern for waxing and waning symptoms likely in the setting of benzo withdrawals and previous intoxication.  Wife at bedside understands patient's precarious situation of essentially being on lifelong benzodiazepines and likely quite dependent on them while we attempt to wean patient down or off benzos if possible during his hospitalization.   Assessment & Plan:   Principal Problem:   AMS (altered mental status) Active Problems:  COPD (chronic obstructive pulmonary disease) ([place])   Fall   History of pulmonary embolism   Depression  Acute metabolic encephalopathy, likely multifactorial, markedly labile Likely polypharmacy with subsequent withdrawal No acute intracranial process noted -Patient's mental status continues to be markedly labile over the past 24 hours,  essentially somnolent and poorly responsive this morning but by lunchtime was ANO x4. -Patient's CIWA protocol has fallen off, will initiate low-dose Librium taper as well as low-dose as needed Ativan for acute breakthrough symptoms using CIWA protocol. -Neurology following, appreciate insight and recommendations, no further indication for LP, continue Eliquis - CT head neck MRI without acute findings/process at intake, repeat MRI unremarkable -Initial EEG negative for seizure-like activity, repeat EEG also unremarkable  Unwitnessed falls, prior to admission:  - Likely acute on chronic ambulatory dysfunction -Patient continues to have difficulty with PT on occasion in the setting of mental status changes.  When ANO x4 he does participate with PT well but has moderate episodes where he is unable to cooperate with PT or follow any meaningful commands. -Likely disposition SNF at this point given ongoing need for care and close monitoring given labile mental status.  Discussed with wife, once mental status is improving she is agreeable for SNF placement given she continues to work and is not home to provide 24-hour care monitoring.  Anxiety, depression -Continue home citalopram, will attempt to wean off of benzos while in-house -Continue Librium taper and CIWA protocol Ativan as above  COPD, stable:  -No acute exacerbation, given minimally elevated CO2 at admission at 64 and only mildly acidotic - would presume his CO2 baseline is around 50  History of PE/DVT -Continue Eliquis  DVT prophylaxis:  Continue Eliquis Code Status: Full code Family Communication: Wife at bedside  Status is: Inpatient  Dispo: The patient is from: Home              Anticipated d/c is to: To be determined likely SNF              Anticipated d/c date is: 48-72 hours pending clinical course and further findings              Patient currently not medically stable for discharge given ongoing mental status changes  from baseline but generally improving.  Consultants:   Neurology  Procedures:   EEG x2, echocardiogram  Antimicrobials:  Not indicated  Subjective: Overnight patient somewhat agitated, received extra doses of benzodiazepine per protocol, patient moderately somnolent this morning, minimally interactive, review of systems markedly limited.  Objective: Vitals:   12/08/19 0009 12/08/19 0130 12/08/19 0259 12/08/19 0432  BP: (!) 153/96   138/85  Pulse: (!) 285 (!) 54 97 (!) 103  Resp: 19   20  Temp: 98.6 F (37 C)   98 F (36.7 C)  TempSrc: Oral   Oral  SpO2: 96%   97%  Weight:      Height:       No intake or output data in the 24 hours ending 12/08/19 0806 Filed Weights   12/04/19 1120  Weight: 111.1 kg    Examination:  General: Patient resting comfortably in bed, poorly arousable but protecting airway, no acute distress HEENT:  Normocephalic atraumatic.  Sclerae nonicteric, noninjected.  Extraocular movements intact bilaterally. Neck:  Without mass or deformity.  Trachea is midline. Lungs:  Clear to auscultate bilaterally without rhonchi, wheeze, or rales. Heart:  Regular rate and rhythm.  Without murmurs, rubs, or gallops. Abdomen:  Soft, nontender, nondistended.  Without guarding  or rebound. Extremities: Without cyanosis, clubbing, edema, or obvious deformity. Vascular:  Dorsalis pedis and posterior tibial pulses palpable bilaterally. Skin:  Warm and dry, no erythema, no ulcerations.  Data Reviewed: I have personally reviewed following labs and imaging studies  CBC: Recent Labs  Lab 12/01/19 1839 12/01/19 1840 12/02/19 0218 12/04/19 1007 12/05/19 0441 12/06/19 0354 12/07/19 0519  WBC 8.3  --   --  11.8* 8.3 9.1 8.3  NEUTROABS 4.8  --   --   --   --   --   --   HGB 14.7   < > 13.9 15.2 13.9 14.3 14.7  HCT 46.2   < > 41.0 46.6 43.7 43.7 44.8  MCV 98.3  --   --  94.9 97.8 95.0 94.9  PLT 187  --   --  210 201 201 224   < > = values in this interval not  displayed.   Basic Metabolic Panel: Recent Labs  Lab 12/01/19 1839 12/01/19 1839 12/01/19 1840 12/01/19 2020 12/02/19 0218 12/04/19 1007 12/05/19 0441 12/06/19 0354 12/07/19 0519  NA 136   < > 137   < > 136 137 139 136 136  K 3.9   < > 3.8   < > 3.8 4.0 3.9 4.5 3.6  CL 101   < > 99  --   --  102 103 99 98  CO2 23  --   --   --   --  20* 23 23 25   GLUCOSE 78   < > 76  --   --  96 90 89 99  BUN 8   < > 9  --   --  10 15 13 11   CREATININE 0.77   < > 0.80  --   --  0.85 0.76 0.73 0.70  CALCIUM 8.5*  --   --   --   --  8.9 8.7* 8.9 9.1   < > = values in this interval not displayed.   GFR: Estimated Creatinine Clearance: 135.9 mL/min (by C-G formula based on SCr of 0.7 mg/dL). Liver Function Tests: Recent Labs  Lab 12/01/19 1839 12/04/19 1007  AST 15 20  ALT 15 16  ALKPHOS 54 70  BILITOT 0.8 1.4*  PROT 6.1* 6.8  ALBUMIN 3.5 3.5   No results for input(s): LIPASE, AMYLASE in the last 168 hours. Recent Labs  Lab 12/02/19 0046 12/04/19 1541  AMMONIA 46* 26   Coagulation Profile: Recent Labs  Lab 12/01/19 1839  INR 1.1   Cardiac Enzymes: Recent Labs  Lab 12/02/19 0157  CKTOTAL 71   BNP (last 3 results) No results for input(s): PROBNP in the last 8760 hours. HbA1C: No results for input(s): HGBA1C in the last 72 hours. CBG: Recent Labs  Lab 12/01/19 1833 12/04/19 0809  GLUCAP 76 88   Lipid Profile: No results for input(s): CHOL, HDL, LDLCALC, TRIG, CHOLHDL, LDLDIRECT in the last 72 hours. Thyroid Function Tests: No results for input(s): TSH, T4TOTAL, FREET4, T3FREE, THYROIDAB in the last 72 hours. Anemia Panel: No results for input(s): VITAMINB12, FOLATE, FERRITIN, TIBC, IRON, RETICCTPCT in the last 72 hours. Sepsis Labs: No results for input(s): PROCALCITON, LATICACIDVEN in the last 168 hours.  Recent Results (from the past 240 hour(s))  Respiratory Panel by RT PCR (Flu A&B, Covid) - Nasopharyngeal Swab     Status: None   Collection Time: 12/01/19   7:28 PM   Specimen: Nasopharyngeal Swab  Result Value Ref Range Status   SARS Coronavirus 2 by  RT PCR NEGATIVE NEGATIVE Final    Comment: (NOTE) SARS-CoV-2 target nucleic acids are NOT DETECTED.  The SARS-CoV-2 RNA is generally detectable in upper respiratoy specimens during the acute phase of infection. The lowest concentration of SARS-CoV-2 viral copies this assay can detect is 131 copies/mL. A negative result does not preclude SARS-Cov-2 infection and should not be used as the sole basis for treatment or other patient management decisions. A negative result may occur with  improper specimen collection/handling, submission of specimen other than nasopharyngeal swab, presence of viral mutation(s) within the areas targeted by this assay, and inadequate number of viral copies (<131 copies/mL). A negative result must be combined with clinical observations, patient history, and epidemiological information. The expected result is Negative.  Fact Sheet for Patients:  PinkCheek.be  Fact Sheet for Healthcare Providers:  GravelBags.it  This test is no t yet approved or cleared by the [place] FDA and  has been authorized for detection and/or diagnosis of SARS-CoV-2 by FDA under an Emergency Use Authorization (EUA). This EUA will remain  in effect (meaning this test can be used) for the duration of the COVID-19 declaration under Section 564(b)(1) of the Act, 21 U.S.C. section 360bbb-3(b)(1), unless the authorization is terminated or revoked sooner.     Influenza A by PCR NEGATIVE NEGATIVE Final   Influenza B by PCR NEGATIVE NEGATIVE Final    Comment: (NOTE) The Xpert Xpress SARS-CoV-2/FLU/RSV assay is intended as an aid in  the diagnosis of influenza from Nasopharyngeal swab specimens and  should not be used as a sole basis for treatment. Nasal washings and  aspirates are unacceptable for Xpert Xpress SARS-CoV-2/FLU/RSV   testing.  Fact Sheet for Patients: PinkCheek.be  Fact Sheet for Healthcare Providers: GravelBags.it  This test is not yet approved or cleared by the [place] FDA and  has been authorized for detection and/or diagnosis of SARS-CoV-2 by  FDA under an Emergency Use Authorization (EUA). This EUA will remain  in effect (meaning this test can be used) for the duration of the  Covid-19 declaration under Section 564(b)(1) of the Act, 21  U.S.C. section 360bbb-3(b)(1), unless the authorization is  terminated or revoked. Performed at [facility], [place] [address], [place], [place] [address]   Culture, blood (Routine X 2) w Reflex to ID Panel     Status: None (Preliminary result)   Collection Time: 12/04/19  9:48 PM   Specimen: BLOOD  Result Value Ref Range Status   Specimen Description BLOOD LEFT ANTECUBITAL  Final   Special Requests   Final    BOTTLES DRAWN AEROBIC AND ANAEROBIC Blood Culture results may not be optimal due to an inadequate volume of blood received in culture bottles   Culture   Final    NO GROWTH 4 DAYS Performed at [facility], [place] [address] [place], [place] [address]    Report Status PENDING  Incomplete  Culture, blood (Routine X 2) w Reflex to ID Panel     Status: None (Preliminary result)   Collection Time: 12/04/19  9:53 PM   Specimen: BLOOD RIGHT HAND  Result Value Ref Range Status   Specimen Description BLOOD RIGHT HAND  Final   Special Requests   Final    BOTTLES DRAWN AEROBIC ONLY Blood Culture adequate volume   Culture   Final    NO GROWTH 4 DAYS Performed at [facility], [place] [address] [place], [place] [address]    Report Status PENDING  Incomplete  Radiology Studies: No results found.  Scheduled Meds: . apixaban  5 mg Oral BID  . citalopram  20 mg Oral Daily   And  . citalopram  40 mg Oral QHS  . folic acid  1 mg Oral Daily  . hydrochlorothiazide   12.5 mg Oral Daily  . irbesartan  150 mg Oral Daily  . mometasone-formoterol  2 puff Inhalation BID  . multivitamin with minerals  1 tablet Oral Daily  . pantoprazole  40 mg Oral Daily  . simvastatin  20 mg Oral q1800   Continuous Infusions:    LOS: 6 days   Time spent: 77min  Artis C Damiean Lukes, DO [facility]  If 7PM-7AM, please contact night-coverage [email or web address]  12/08/2019, 8:06 AM

## 2019-12-08 NOTE — NC FL2 (Signed)
[place] LEVEL OF CARE SCREENING TOOL     IDENTIFICATION  Patient Name: Jason Barajas Birthdate: 06/08/1965 Sex: male Admission Date (Current Location): 12/01/2019  [facility] and [place] Number:  [employer] and Address:  [facility] [place], [place] [address] [place], [place] [address]      Provider Number: [id number]  Attending Physician Name and Address:  Little Ishikawa, MD  Relative Name and Phone Number:       Current Level of Care: Hospital Recommended Level of Care: [place] Prior Approval Number:    Date Approved/Denied:   PASRR Number: manual review  Discharge Plan: SNF    Current Diagnoses: Patient Active Problem List   Diagnosis Date Noted  . Fall 12/02/2019  . History of pulmonary embolism 12/02/2019  . Depression 12/02/2019  . AMS (altered mental status) 12/01/2019  . SMOKER 05/01/2008  . PULMONARY NODULE 05/01/2008  . COPD (chronic obstructive pulmonary disease) ([place]) 03/16/2008    Orientation RESPIRATION BLADDER Height & Weight     Self, Time, Place  O2 (see d/c summary for oxygen needs) Incontinent Weight: 111.1 kg Height:  6' (182.9 cm)  BEHAVIORAL SYMPTOMS/MOOD NEUROLOGICAL BOWEL NUTRITION STATUS      Continent Diet (heart healthy with thin liquids)  AMBULATORY STATUS COMMUNICATION OF NEEDS Skin   Limited Assist Verbally Skin abrasions, Bruising (Bruising to bilateral arms/ abrasion to left knee)                       Personal Care Assistance Level of Assistance  Bathing, Feeding, Dressing Bathing Assistance: Limited assistance Feeding assistance: Independent Dressing Assistance: Limited assistance     Functional Limitations Info  Sight, Hearing, Speech Sight Info: Adequate Hearing Info: Adequate Speech Info: Adequate    SPECIAL CARE FACTORS FREQUENCY  PT (By licensed PT), OT (By licensed OT), Speech therapy     PT Frequency: 5x/wk OT Frequency: 5x/wk     Speech  Therapy Frequency: 5x/wk      Contractures Contractures Info: Not present    Additional Factors Info  Code Status, Allergies, Psychotropic Code Status Info: Full Allergies Info: penicillin/ codeine Psychotropic Info: celexa 20 mg daily and 40 mg at bedtime         Current Medications (12/08/2019):  This is the current hospital active medication list Current Facility-Administered Medications  Medication Dose Route Frequency Provider Last Rate Last Admin  . albuterol (PROVENTIL) (2.5 MG/3ML) 0.083% nebulizer solution 2.5 mg  2.5 mg Nebulization Q4H PRN Shela Leff, MD   2.5 mg at 12/06/19 1704  . apixaban (ELIQUIS) tablet 5 mg  5 mg Oral BID Joselyn Glassman A, RPH   5 mg at 12/08/19 1249  . chlordiazePOXIDE (LIBRIUM) capsule 25 mg  25 mg Oral QID Little Ishikawa, MD       Followed by  . [START ON 12/10/2019] chlordiazePOXIDE (LIBRIUM) capsule 10 mg  10 mg Oral QID Little Ishikawa, MD       Followed by  . [START ON 12/14/2019] chlordiazePOXIDE (LIBRIUM) capsule 5 mg  5 mg Oral QID Little Ishikawa, MD      . citalopram (CELEXA) tablet 20 mg  20 mg Oral Daily Little Ishikawa, MD   20 mg at 12/08/19 1249   And  . citalopram (CELEXA) tablet 40 mg  40 mg Oral QHS Little Ishikawa, MD   40 mg at 12/07/19 2143  . folic acid (FOLVITE) tablet 1 mg  1 mg Oral Daily Little Ishikawa, MD   1 mg at 12/08/19 1250  . hydrochlorothiazide (MICROZIDE) capsule 12.5 mg  12.5 mg Oral Daily Little Ishikawa, MD   12.5 mg at 12/08/19 1249  . influenza vac split quadrivalent PF (FLUARIX) injection 0.5 mL  0.5 mL Intramuscular Prior to discharge Little Ishikawa, MD      . irbesartan (AVAPRO) tablet 150 mg  150 mg Oral Daily Shela Leff, MD   150 mg at 12/08/19 1250  . LORazepam (ATIVAN) tablet 1-4 mg  1-4 mg Oral Q1H PRN Little Ishikawa, MD      . mometasone-formoterol [facility]) 200-5 MCG/ACT inhaler 2 puff  2 puff Inhalation BID Shela Leff, MD   2  puff at 12/08/19 1258  . multivitamin with minerals tablet 1 tablet  1 tablet Oral Daily Little Ishikawa, MD   1 tablet at 12/08/19 1249  . pantoprazole (PROTONIX) EC tablet 40 mg  40 mg Oral Daily Shela Leff, MD   40 mg at 12/08/19 1250  . simvastatin (ZOCOR) tablet 20 mg  20 mg Oral q1800 Rosalin Hawking, MD   20 mg at 12/06/19 1704  . sodium chloride flush (NS) 0.9 % injection 3 mL  3 mL Intravenous Q12H Little Ishikawa, MD         Discharge Medications: Please see discharge summary for a list of discharge medications.  Relevant Imaging Results:  Relevant Lab Results:   Additional Information SS#: [id number]  Pollie Friar, RN

## 2019-12-09 DIAGNOSIS — F13239 Sedative, hypnotic or anxiolytic dependence with withdrawal, unspecified: Secondary | ICD-10-CM

## 2019-12-09 DIAGNOSIS — F13939 Sedative, hypnotic or anxiolytic use, unspecified with withdrawal, unspecified: Secondary | ICD-10-CM

## 2019-12-09 DIAGNOSIS — W19XXXA Unspecified fall, initial encounter: Secondary | ICD-10-CM | POA: Diagnosis not present

## 2019-12-09 DIAGNOSIS — Z79899 Other long term (current) drug therapy: Secondary | ICD-10-CM

## 2019-12-09 DIAGNOSIS — F32A Depression, unspecified: Secondary | ICD-10-CM | POA: Diagnosis not present

## 2019-12-09 DIAGNOSIS — F13231 Sedative, hypnotic or anxiolytic dependence with withdrawal delirium: Secondary | ICD-10-CM

## 2019-12-09 DIAGNOSIS — J42 Unspecified chronic bronchitis: Secondary | ICD-10-CM | POA: Diagnosis not present

## 2019-12-09 DIAGNOSIS — Z86711 Personal history of pulmonary embolism: Secondary | ICD-10-CM | POA: Diagnosis not present

## 2019-12-09 LAB — CULTURE, BLOOD (ROUTINE X 2)
Culture: NO GROWTH
Culture: NO GROWTH
Special Requests: ADEQUATE

## 2019-12-09 LAB — GLUCOSE, CAPILLARY: Glucose-Capillary: 95 mg/dL (ref 70–99)

## 2019-12-09 NOTE — Significant Event (Signed)
Rapid Response Event Note   Reason for Call :  Decreased LOC  Initial Focused Assessment:  Called from the RN stating that her patient has had a change in  Neuro status and she was wanting a second set of eyes on her patient.  Patient came into the hospital with AMS and is currently on CIWA precautions.  He has been receiving ativan for his higher CIWA scores and received 4 mg around 0146.    Patient was very sleepy when I walked into the room.  He was arousable to pain and eventually woke up to follow commands.  168/93 O2 95 HR 92 resp 18     Interventions:  Assessed patient and took vitals  Plan of Care:  Discussed with RN to hold ativan unless patient is really needing it so we can see if he wakes up a bit more.  Please call back if there are any acute changes in LOC   Event Summary:   MD Notified:  Call Time: [place] Time: 5364 End Time: [place], RN

## 2019-12-09 NOTE — Progress Notes (Signed)
PROGRESS NOTE    Jason Barajas  LEX:[id number] DOB: 1965/10/28 DOA: 12/01/2019 PCP: Ronita Hipps, MD   Brief Narrative:  Jason Barajas is a 54 y.o. male with medical history significant of anxiety, depression, COPD, PE/DVT on Eliquis, GERD, hypertension, hyperlipidemia, IBS presenting to the ED with altered mental status. Patient is very somnolent and no history could be obtained from him. History provided by wife at bedside who states that yesterday patient had an unwitnessed fall. Then again today he had another unwitnessed fall. States this evening patient was acting normal and talking to her and another neighbor. Then all of a sudden his speech became slurred and he became unresponsive.  His eyes were still open and he did not lose consciousness.  He takes Xanax 1 mg up to 5 times a day for anxiety.  He also takes gabapentin for panic attacks.  States she fill out his pillbox and keeps the rest of the medication with her. She is not sure how much Xanax he took today. Wife states patient has depression for which he takes citalopram and is seen by a psychiatrist. States patient has not expressed any suicidal thoughts. States he has chronic shortness of breath due to COPD but has not had any fevers, cough, nausea, vomiting, or diarrhea.  He has not complained of any abdominal pain.  Wife confirms that he does not drink any alcohol.  He has been vaccinated against Covid. In ED: Initially code stroke was activated and patient was seen by neurology.  TPA not given as he is on Eliquis.  Head CT normal.  CT angiogram head and neck negative for LVO.  Brain MRI normal. WBC 8.3, hemoglobin 14.7, hematocrit 46.2, platelet 187K.  Sodium 136, potassium 3.9, chloride 101, bicarb 23, BUN 8, creatinine 0.7, glucose 78.  LFTs normal.  INR 1.1.  UA not suggestive of infection.  UDS positive for benzodiazepines (patient takes Xanax at home).  SARS-CoV-2 PCR test negative.  Influenza panel negative.  Blood ethanol  level undetectable.  Salicylate and acetaminophen levels pending.  Chest x-ray negative for acute cardiopulmonary abnormality.  CT C-spine negative for acute osseous abnormality.  EKG without acute changes. ABG with pH 7.33, PCO2 54, PO2 60.  He was placed on BiPAP briefly which did not improve his mental status. Patient received 1 L IV fluid bolus. Hospitalist called to admit.  Patient admitted as above initially concern to be a stroke rule out subsequently noted to be polypharmacy in the setting of Xanax and Soma.  Patient was given flumazenil at intake with minimal improvement likely in the setting of Soma ingestion.  Patient's home medications were held at intake appropriately due to concern for polypharmacy, MRI and EEG initially negative.  Patient's mental status did not improve over the next 48 hours concerning for withdrawal symptoms as such patient was placed on low-dose benzos with moderate improvement in mental status.  Unfortunately over the next 48 hours patient would have moderate swelling and mental status with repeat MRI and EEG unremarkable.  At this point we are attempting to titrate patient's benzodiazepine dose given concern for waxing and waning symptoms likely in the setting of benzo withdrawals and previous intoxication.  Wife at bedside understands patient's precarious situation of essentially being on lifelong benzodiazepines and likely quite dependent on them while we attempt to wean patient down or off benzos if possible during his hospitalization.   Assessment & Plan:   Principal Problem:   AMS (altered mental status) Active Problems:  COPD (chronic obstructive pulmonary disease) ([place])   Fall   History of pulmonary embolism   Depression   Polypharmacy   Withdrawal from benzodiazepine ([place])  Acute metabolic encephalopathy, likely multifactorial, markedly labile Likely polypharmacy/accidental overdose with subsequent withdrawal No acute intracranial process noted -  Patient's mental status continues to be somewhat labile over the past 24 hours, he is somewhat improving minimally since yesterday after addition of scheduled Librium taper - Unfortunately it appears the patient's previous decline was in the setting of missed benzodiazepines and likely recurrent withdrawal. - Neurology previously following, appreciate insight and recommendations - CT head neck MRI without acute findings/process at intake, repeat MRI unremarkable -Initial EEG negative for seizure-like activity, repeat EEG also unremarkable  Unwitnessed falls, prior to admission:  - Likely acute on chronic ambulatory dysfunction -Patient continues to have difficulty with PT on occasion in the setting of mental status changes.  When ANO x4 he does participate with PT well but has moderate episodes where he is unable to cooperate with PT or follow any meaningful commands. -Likely disposition SNF at this point given ongoing need for care and close monitoring given labile mental status.  Discussed with wife, once mental status is improving she is agreeable for SNF placement given she continues to work and is not home to provide 24-hour care monitoring.  Anxiety, depression -Continue home citalopram, will attempt to wean off of benzos while in-house -Continue Librium taper and CIWA protocol Ativan as above  COPD, stable:  -No acute exacerbation, given minimally elevated CO2 at admission at 61 and only mildly acidotic - would presume his CO2 baseline is around 50  History of PE/DVT -Continue Eliquis  DVT prophylaxis:  Continue Eliquis Code Status: Full code Family Communication: Wife at bedside  Status is: Inpatient  Dispo: The patient is from: Home              Anticipated d/c is to: To be determined - likely SNF              Anticipated d/c date is: 48-72 hours pending clinical course and further findings              Patient currently not medically stable for discharge given ongoing  mental status changes from baseline but generally improving.  Consultants:   Neurology  Procedures:   EEG x2, echocardiogram  Antimicrobials:  Not indicated  Subjective: No acute issues or events overnight, patient apparently slept quite well, this morning he is awake alert oriented to person and general situation but is having resting tremors in both hands worse with intention concerning for initial signs of withdrawal.  Discussed with wife to be cognizant of these tremors as she may be able to help warn nursing staff for impending withdrawal.  Objective: Vitals:   12/08/19 2013 12/08/19 2332 12/09/19 0402 12/09/19 0750  BP:  126/88 (!) 141/90   Pulse:  92 62   Resp:  20 18   Temp:  99 F (37.2 C) 98 F (36.7 C)   TempSrc:  Oral Oral   SpO2: 93% 95% 94% 100%  Weight:      Height:        Intake/Output Summary (Last 24 hours) at 12/09/2019 0818 Last data filed at 12/08/2019 1222 Gross per 24 hour  Intake 120 ml  Output 200 ml  Net -80 ml   Filed Weights   12/04/19 1120  Weight: 111.1 kg    Examination:  General: Patient sitting up in bed eating breakfast his  wife brought him with tremulous hands.  He has worsening tremor when attempting to feed himself.  Otherwise no acute distress alert and oriented to person and place only. HEENT:  Normocephalic atraumatic.  Sclerae nonicteric, noninjected.  Extraocular movements intact bilaterally. Neck:  Without mass or deformity.  Trachea is midline. Lungs:  Clear to auscultate bilaterally without rhonchi, wheeze, or rales. Heart:  Regular rate and rhythm.  Without murmurs, rubs, or gallops. Abdomen:  Soft, nontender, nondistended.  Without guarding or rebound. Extremities: Without cyanosis, clubbing, edema, or obvious deformity. Neuro: Resting tremor bilateral upper extremities worse with intention without overt deficit. Vascular:  Dorsalis pedis and posterior tibial pulses palpable bilaterally. Skin:  Warm and dry, no  erythema, no ulcerations.  Data Reviewed: I have personally reviewed following labs and imaging studies  CBC: Recent Labs  Lab 12/04/19 1007 12/05/19 0441 12/06/19 0354 12/07/19 0519  WBC 11.8* 8.3 9.1 8.3  HGB 15.2 13.9 14.3 14.7  HCT 46.6 43.7 43.7 44.8  MCV 94.9 97.8 95.0 94.9  PLT 210 201 201 671   Basic Metabolic Panel: Recent Labs  Lab 12/04/19 1007 12/05/19 0441 12/06/19 0354 12/07/19 0519  NA 137 139 136 136  K 4.0 3.9 4.5 3.6  CL 102 103 99 98  CO2 20* 23 23 25   GLUCOSE 96 90 89 99  BUN 10 15 13 11   CREATININE 0.85 0.76 0.73 0.70  CALCIUM 8.9 8.7* 8.9 9.1   GFR: Estimated Creatinine Clearance: 135.9 mL/min (by C-G formula based on SCr of 0.7 mg/dL). Liver Function Tests: Recent Labs  Lab 12/04/19 1007  AST 20  ALT 16  ALKPHOS 70  BILITOT 1.4*  PROT 6.8  ALBUMIN 3.5   No results for input(s): LIPASE, AMYLASE in the last 168 hours. Recent Labs  Lab 12/04/19 1541  AMMONIA 26   Coagulation Profile: No results for input(s): INR, PROTIME in the last 168 hours. Cardiac Enzymes: No results for input(s): CKTOTAL, CKMB, CKMBINDEX, TROPONINI in the last 168 hours. BNP (last 3 results) No results for input(s): PROBNP in the last 8760 hours. HbA1C: No results for input(s): HGBA1C in the last 72 hours. CBG: Recent Labs  Lab 12/04/19 0809  GLUCAP 88   Lipid Profile: No results for input(s): CHOL, HDL, LDLCALC, TRIG, CHOLHDL, LDLDIRECT in the last 72 hours. Thyroid Function Tests: No results for input(s): TSH, T4TOTAL, FREET4, T3FREE, THYROIDAB in the last 72 hours. Anemia Panel: No results for input(s): VITAMINB12, FOLATE, FERRITIN, TIBC, IRON, RETICCTPCT in the last 72 hours. Sepsis Labs: No results for input(s): PROCALCITON, LATICACIDVEN in the last 168 hours.  Recent Results (from the past 240 hour(s))  Respiratory Panel by RT PCR (Flu A&B, Covid) - Nasopharyngeal Swab     Status: None   Collection Time: 12/01/19  7:28 PM   Specimen:  Nasopharyngeal Swab  Result Value Ref Range Status   SARS Coronavirus 2 by RT PCR NEGATIVE NEGATIVE Final    Comment: (NOTE) SARS-CoV-2 target nucleic acids are NOT DETECTED.  The SARS-CoV-2 RNA is generally detectable in upper respiratoy specimens during the acute phase of infection. The lowest concentration of SARS-CoV-2 viral copies this assay can detect is 131 copies/mL. A negative result does not preclude SARS-Cov-2 infection and should not be used as the sole basis for treatment or other patient management decisions. A negative result may occur with  improper specimen collection/handling, submission of specimen other than nasopharyngeal swab, presence of viral mutation(s) within the areas targeted by this assay, and inadequate number of viral  copies (<131 copies/mL). A negative result must be combined with clinical observations, patient history, and epidemiological information. The expected result is Negative.  Fact Sheet for Patients:  PinkCheek.be  Fact Sheet for Healthcare Providers:  GravelBags.it  This test is no t yet approved or cleared by the [place] FDA and  has been authorized for detection and/or diagnosis of SARS-CoV-2 by FDA under an Emergency Use Authorization (EUA). This EUA will remain  in effect (meaning this test can be used) for the duration of the COVID-19 declaration under Section 564(b)(1) of the Act, 21 U.S.C. section 360bbb-3(b)(1), unless the authorization is terminated or revoked sooner.     Influenza A by PCR NEGATIVE NEGATIVE Final   Influenza B by PCR NEGATIVE NEGATIVE Final    Comment: (NOTE) The Xpert Xpress SARS-CoV-2/FLU/RSV assay is intended as an aid in  the diagnosis of influenza from Nasopharyngeal swab specimens and  should not be used as a sole basis for treatment. Nasal washings and  aspirates are unacceptable for Xpert Xpress SARS-CoV-2/FLU/RSV  testing.  Fact Sheet  for Patients: PinkCheek.be  Fact Sheet for Healthcare Providers: GravelBags.it  This test is not yet approved or cleared by the [place] FDA and  has been authorized for detection and/or diagnosis of SARS-CoV-2 by  FDA under an Emergency Use Authorization (EUA). This EUA will remain  in effect (meaning this test can be used) for the duration of the  Covid-19 declaration under Section 564(b)(1) of the Act, 21  U.S.C. section 360bbb-3(b)(1), unless the authorization is  terminated or revoked. Performed at [facility], [place] [address], [place], [place] [address]   Culture, blood (Routine X 2) w Reflex to ID Panel     Status: None   Collection Time: 12/04/19  9:48 PM   Specimen: BLOOD  Result Value Ref Range Status   Specimen Description BLOOD LEFT ANTECUBITAL  Final   Special Requests   Final    BOTTLES DRAWN AEROBIC AND ANAEROBIC Blood Culture results may not be optimal due to an inadequate volume of blood received in culture bottles   Culture   Final    NO GROWTH 5 DAYS Performed at [facility], [place] [address] [place], [place] [address]    Report Status 12/09/2019 FINAL  Final  Culture, blood (Routine X 2) w Reflex to ID Panel     Status: None   Collection Time: 12/04/19  9:53 PM   Specimen: BLOOD RIGHT HAND  Result Value Ref Range Status   Specimen Description BLOOD RIGHT HAND  Final   Special Requests   Final    BOTTLES DRAWN AEROBIC ONLY Blood Culture adequate volume   Culture   Final    NO GROWTH 5 DAYS Performed at [facility], [place] [address], [place], [place] [address]    Report Status 12/09/2019 FINAL  Final    Radiology Studies: No results found.  Scheduled Meds: . apixaban  5 mg Oral BID  . chlordiazePOXIDE  25 mg Oral QID   Followed by  . [START ON 12/10/2019] chlordiazePOXIDE  10 mg Oral QID   Followed by  . [START ON 12/14/2019] chlordiazePOXIDE  5 mg Oral QID  . citalopram   20 mg Oral Daily   And  . citalopram  40 mg Oral QHS  . folic acid  1 mg Oral Daily  . hydrochlorothiazide  12.5 mg Oral Daily  . irbesartan  150 mg Oral Daily  . mometasone-formoterol  2 puff Inhalation  BID  . multivitamin with minerals  1 tablet Oral Daily  . pantoprazole  40 mg Oral Daily  . simvastatin  20 mg Oral q1800  . sodium chloride flush  3 mL Intravenous Q12H   Continuous Infusions:    LOS: 7 days   Time spent: 55min  Frazier C Alexarae Oliva, DO [facility]  If 7PM-7AM, please contact night-coverage [email or web address]  12/09/2019, 8:18 AM

## 2019-12-10 DIAGNOSIS — J42 Unspecified chronic bronchitis: Secondary | ICD-10-CM | POA: Diagnosis not present

## 2019-12-10 DIAGNOSIS — Z86711 Personal history of pulmonary embolism: Secondary | ICD-10-CM | POA: Diagnosis not present

## 2019-12-10 DIAGNOSIS — F32A Depression, unspecified: Secondary | ICD-10-CM | POA: Diagnosis not present

## 2019-12-10 DIAGNOSIS — W19XXXA Unspecified fall, initial encounter: Secondary | ICD-10-CM | POA: Diagnosis not present

## 2019-12-10 NOTE — Progress Notes (Signed)
PROGRESS NOTE    Jason Barajas  QMV:[id number] DOB: 08-24-1965 DOA: 12/01/2019 PCP: Ronita Hipps, MD   Brief Narrative:  Jason Barajas is a 54 y.o. male with medical history significant of anxiety, depression, COPD, PE/DVT on Eliquis, GERD, hypertension, hyperlipidemia, IBS presenting to the ED with altered mental status. Patient is very somnolent and no history could be obtained from him. History provided by wife at bedside who states that yesterday patient had an unwitnessed fall. Then again today he had another unwitnessed fall. States this evening patient was acting normal and talking to her and another neighbor. Then all of a sudden his speech became slurred and he became unresponsive.  His eyes were still open and he did not lose consciousness.  He takes Xanax 1 mg up to 5 times a day for anxiety.  He also takes gabapentin for panic attacks.  States she fill out his pillbox and keeps the rest of the medication with her. She is not sure how much Xanax he took today. Wife states patient has depression for which he takes citalopram and is seen by a psychiatrist. States patient has not expressed any suicidal thoughts. States he has chronic shortness of breath due to COPD but has not had any fevers, cough, nausea, vomiting, or diarrhea.  He has not complained of any abdominal pain.  Wife confirms that he does not drink any alcohol.  He has been vaccinated against Covid. In ED: Initially code stroke was activated and patient was seen by neurology.  TPA not given as he is on Eliquis.  Head CT normal.  CT angiogram head and neck negative for LVO.  Brain MRI normal. WBC 8.3, hemoglobin 14.7, hematocrit 46.2, platelet 187K.  Sodium 136, potassium 3.9, chloride 101, bicarb 23, BUN 8, creatinine 0.7, glucose 78.  LFTs normal.  INR 1.1.  UA not suggestive of infection.  UDS positive for benzodiazepines (patient takes Xanax at home).  SARS-CoV-2 PCR test negative.  Influenza panel negative.  Blood ethanol  level undetectable.  Salicylate and acetaminophen levels pending.  Chest x-ray negative for acute cardiopulmonary abnormality.  CT C-spine negative for acute osseous abnormality.  EKG without acute changes. ABG with pH 7.33, PCO2 54, PO2 60.  He was placed on BiPAP briefly which did not improve his mental status. Patient received 1 L IV fluid bolus. Hospitalist called to admit.  Patient admitted as above initially concern to be a stroke rule out subsequently noted to be polypharmacy in the setting of Xanax and Soma.  Patient was given flumazenil at intake with minimal improvement likely in the setting of Soma ingestion.  Patient's home medications were held at intake appropriately due to concern for polypharmacy, MRI and EEG initially negative.  Patient's mental status did not improve over the next 48 hours concerning for withdrawal symptoms as such patient was placed on low-dose benzos with moderate improvement in mental status.  Unfortunately over the next 48 hours patient would have moderate swelling and mental status with repeat MRI and EEG unremarkable.  At this point we are attempting to titrate patient's benzodiazepine dose given concern for waxing and waning symptoms likely in the setting of benzo withdrawals and previous intoxication.  Wife at bedside understands patient's precarious situation of essentially being on lifelong benzodiazepines and likely quite dependent on them while we attempt to wean patient down or off benzos if possible during his hospitalization.   Assessment & Plan:   Principal Problem:   AMS (altered mental status) Active Problems:  COPD (chronic obstructive pulmonary disease) ([place])   Fall   History of pulmonary embolism   Depression   Polypharmacy   Withdrawal from benzodiazepine ([place])   Acute metabolic encephalopathy, likely multifactorial, markedly labile Likely polypharmacy/accidental overdose with subsequent withdrawal No acute intracranial process noted -  Patient's mental status continues to stabilize 24 hours, he is somewhat improving minimally since yesterday after addition of scheduled Librium taper - Unfortunately it appears the patient's previous decline was in the setting of missed benzodiazepines and likely recurrent withdrawal. - Neurology previously following, appreciate insight and recommendations - CT head neck MRI without acute findings/process at intake, repeat MRI unremarkable -Initial EEG negative for seizure-like activity, repeat EEG also unremarkable  Unwitnessed falls, prior to admission:  - Likely acute on chronic ambulatory dysfunction -PT continues to follow, likely disposition SNF given ongoing needs and ambulatory dysfunction and instability.  Certainly if patient continues to improve drastically over the next 48 hours would consider discharge home with home health.  Appreciate PTs insight and recommendations in this.  Anxiety, depression -Continue home citalopram, will attempt to wean off of benzos while in-house -Continue Librium taper and CIWA protocol Ativan as above  COPD, stable:  -No acute exacerbation, given minimally elevated CO2 at admission at 84 and only mildly acidotic - would presume his CO2 baseline is around 50  History of PE/DVT -Continue Eliquis  DVT prophylaxis:  Continue Eliquis Code Status: Full code Family Communication: Wife at bedside  Status is: Inpatient  Dispo: The patient is from: Home              Anticipated d/c is to: To be determined - likely SNF              Anticipated d/c date is: 24-48 hours pending clinical course and further findings              Patient currently not medically stable for discharge given ongoing mental status changes from baseline but generally improving.  Consultants:   Neurology  Procedures:   EEG x2, echocardiogram  Antimicrobials:  Not indicated  Subjective: No acute issues or events overnight, patient slept very well overnight, awake  alert oriented this morning to person place and time, appears to be more lucid than he has over the past week, wife updated over the phone also agrees patient looks quite well today, not yet back to baseline but at least ambulatory with staff in the room and walker able to get around somewhat on his own with assistance.  Objective: Vitals:   12/09/19 2041 12/10/19 0000 12/10/19 0420 12/10/19 0804  BP:  (!) 144/88 124/81 (!) 148/82  Pulse: (!) 102 (!) 103 (!) 58 85  Resp: 18 18 15 17   Temp: 99.1 F (37.3 C) 98.6 F (37 C) 98.1 F (36.7 C) 98.3 F (36.8 C)  TempSrc: Oral Oral Oral Oral  SpO2: 94% 97% 94% 92%  Weight:      Height:       No intake or output data in the 24 hours ending 12/10/19 0813 Filed Weights   12/04/19 1120  Weight: 111.1 kg    Examination:  General: Resting comfortably in bed eating breakfast, mild tremors in the hands but otherwise AO x4 HEENT:  Normocephalic atraumatic.  Sclerae nonicteric, noninjected.  Extraocular movements intact bilaterally. Neck:  Without mass or deformity.  Trachea is midline. Lungs:  Clear to auscultate bilaterally without rhonchi, wheeze, or rales. Heart:  Regular rate and rhythm.  Without murmurs, rubs, or gallops. Abdomen:  Soft, nontender, nondistended.  Without guarding or rebound. Extremities: Without cyanosis, clubbing, edema, or obvious deformity. Neuro: Resting tremor bilateral upper extremities worse with intention without overt deficit. Vascular:  Dorsalis pedis and posterior tibial pulses palpable bilaterally. Skin:  Warm and dry, no erythema, no ulcerations.  Data Reviewed: I have personally reviewed following labs and imaging studies  CBC: Recent Labs  Lab 12/04/19 1007 12/05/19 0441 12/06/19 0354 12/07/19 0519  WBC 11.8* 8.3 9.1 8.3  HGB 15.2 13.9 14.3 14.7  HCT 46.6 43.7 43.7 44.8  MCV 94.9 97.8 95.0 94.9  PLT 210 201 201 662   Basic Metabolic Panel: Recent Labs  Lab 12/04/19 1007 12/05/19 0441  12/06/19 0354 12/07/19 0519  NA 137 139 136 136  K 4.0 3.9 4.5 3.6  CL 102 103 99 98  CO2 20* 23 23 25   GLUCOSE 96 90 89 99  BUN 10 15 13 11   CREATININE 0.85 0.76 0.73 0.70  CALCIUM 8.9 8.7* 8.9 9.1   GFR: Estimated Creatinine Clearance: 135.9 mL/min (by C-G formula based on SCr of 0.7 mg/dL). Liver Function Tests: Recent Labs  Lab 12/04/19 1007  AST 20  ALT 16  ALKPHOS 70  BILITOT 1.4*  PROT 6.8  ALBUMIN 3.5   No results for input(s): LIPASE, AMYLASE in the last 168 hours. Recent Labs  Lab 12/04/19 1541  AMMONIA 26   Coagulation Profile: No results for input(s): INR, PROTIME in the last 168 hours. Cardiac Enzymes: No results for input(s): CKTOTAL, CKMB, CKMBINDEX, TROPONINI in the last 168 hours. BNP (last 3 results) No results for input(s): PROBNP in the last 8760 hours. HbA1C: No results for input(s): HGBA1C in the last 72 hours. CBG: Recent Labs  Lab 12/04/19 0809 12/09/19 0909  GLUCAP 88 95   Lipid Profile: No results for input(s): CHOL, HDL, LDLCALC, TRIG, CHOLHDL, LDLDIRECT in the last 72 hours. Thyroid Function Tests: No results for input(s): TSH, T4TOTAL, FREET4, T3FREE, THYROIDAB in the last 72 hours. Anemia Panel: No results for input(s): VITAMINB12, FOLATE, FERRITIN, TIBC, IRON, RETICCTPCT in the last 72 hours. Sepsis Labs: No results for input(s): PROCALCITON, LATICACIDVEN in the last 168 hours.  Recent Results (from the past 240 hour(s))  Respiratory Panel by RT PCR (Flu A&B, Covid) - Nasopharyngeal Swab     Status: None   Collection Time: 12/01/19  7:28 PM   Specimen: Nasopharyngeal Swab  Result Value Ref Range Status   SARS Coronavirus 2 by RT PCR NEGATIVE NEGATIVE Final    Comment: (NOTE) SARS-CoV-2 target nucleic acids are NOT DETECTED.  The SARS-CoV-2 RNA is generally detectable in upper respiratoy specimens during the acute phase of infection. The lowest concentration of SARS-CoV-2 viral copies this assay can detect is 131  copies/mL. A negative result does not preclude SARS-Cov-2 infection and should not be used as the sole basis for treatment or other patient management decisions. A negative result may occur with  improper specimen collection/handling, submission of specimen other than nasopharyngeal swab, presence of viral mutation(s) within the areas targeted by this assay, and inadequate number of viral copies (<131 copies/mL). A negative result must be combined with clinical observations, patient history, and epidemiological information. The expected result is Negative.  Fact Sheet for Patients:  PinkCheek.be  Fact Sheet for Healthcare Providers:  GravelBags.it  This test is no t yet approved or cleared by the [place] FDA and  has been authorized for detection and/or diagnosis of SARS-CoV-2 by FDA under an Emergency Use Authorization (EUA). This EUA will  remain  in effect (meaning this test can be used) for the duration of the COVID-19 declaration under Section 564(b)(1) of the Act, 21 U.S.C. section 360bbb-3(b)(1), unless the authorization is terminated or revoked sooner.     Influenza A by PCR NEGATIVE NEGATIVE Final   Influenza B by PCR NEGATIVE NEGATIVE Final    Comment: (NOTE) The Xpert Xpress SARS-CoV-2/FLU/RSV assay is intended as an aid in  the diagnosis of influenza from Nasopharyngeal swab specimens and  should not be used as a sole basis for treatment. Nasal washings and  aspirates are unacceptable for Xpert Xpress SARS-CoV-2/FLU/RSV  testing.  Fact Sheet for Patients: PinkCheek.be  Fact Sheet for Healthcare Providers: GravelBags.it  This test is not yet approved or cleared by the [place] FDA and  has been authorized for detection and/or diagnosis of SARS-CoV-2 by  FDA under an Emergency Use Authorization (EUA). This EUA will remain  in effect (meaning  this test can be used) for the duration of the  Covid-19 declaration under Section 564(b)(1) of the Act, 21  U.S.C. section 360bbb-3(b)(1), unless the authorization is  terminated or revoked. Performed at [facility], [place] [address] [place], [place] [address]   Culture, blood (Routine X 2) w Reflex to ID Panel     Status: None   Collection Time: 12/04/19  9:48 PM   Specimen: BLOOD  Result Value Ref Range Status   Specimen Description BLOOD LEFT ANTECUBITAL  Final   Special Requests   Final    BOTTLES DRAWN AEROBIC AND ANAEROBIC Blood Culture results may not be optimal due to an inadequate volume of blood received in culture bottles   Culture   Final    NO GROWTH 5 DAYS Performed at [facility], [place] [address], [place], [place] [address]    Report Status 12/09/2019 FINAL  Final  Culture, blood (Routine X 2) w Reflex to ID Panel     Status: None   Collection Time: 12/04/19  9:53 PM   Specimen: BLOOD RIGHT HAND  Result Value Ref Range Status   Specimen Description BLOOD RIGHT HAND  Final   Special Requests   Final    BOTTLES DRAWN AEROBIC ONLY Blood Culture adequate volume   Culture   Final    NO GROWTH 5 DAYS Performed at [facility], [place] [address] [place], [place] [address]    Report Status 12/09/2019 FINAL  Final    Radiology Studies: No results found.  Scheduled Meds: . apixaban  5 mg Oral BID  . chlordiazePOXIDE  25 mg Oral QID   Followed by  . chlordiazePOXIDE  10 mg Oral QID   Followed by  . [START ON 12/14/2019] chlordiazePOXIDE  5 mg Oral QID  . citalopram  20 mg Oral Daily   And  . citalopram  40 mg Oral QHS  . folic acid  1 mg Oral Daily  . hydrochlorothiazide  12.5 mg Oral Daily  . irbesartan  150 mg Oral Daily  . mometasone-formoterol  2 puff Inhalation BID  . multivitamin with minerals  1 tablet Oral Daily  . pantoprazole  40 mg Oral Daily  . simvastatin  20 mg Oral q1800  . sodium chloride flush  3 mL Intravenous Q12H    Continuous Infusions:    LOS: 8 days   Time spent: 66min  Ebbie C Kristell Wooding, DO [facility]  If 7PM-7AM, please contact night-coverage [email or web address]  12/10/2019, 8:13 AM

## 2019-12-11 DIAGNOSIS — Z86711 Personal history of pulmonary embolism: Secondary | ICD-10-CM | POA: Diagnosis not present

## 2019-12-11 DIAGNOSIS — J42 Unspecified chronic bronchitis: Secondary | ICD-10-CM | POA: Diagnosis not present

## 2019-12-11 DIAGNOSIS — F32A Depression, unspecified: Secondary | ICD-10-CM | POA: Diagnosis not present

## 2019-12-11 DIAGNOSIS — W19XXXA Unspecified fall, initial encounter: Secondary | ICD-10-CM | POA: Diagnosis not present

## 2019-12-11 NOTE — TOC Progression Note (Addendum)
Transition of Care [facility]) - Progression Note    Patient Details  Name: Jason Barajas MRN: [id number] Date of Birth: 1966-01-18  Transition [facility] Care [facility]) CM/SW [place], LCSW Phone Number: 12/11/2019, 9:25 AM  Clinical Narrative:    CSW notes patient is hopeful for discharge home, will confirm with patient. [place] currently under in-person level II review in case plan changes back to SNF.    Expected Discharge Plan: [place] Barriers to Discharge: Continued Medical Work up  Expected Discharge Plan and Services Expected Discharge Plan: [place] In-house Referral: Clinical Social Work Discharge Planning Services: CM Consult Post Acute Care Choice: [place] arrangements for the past 2 months: Single Family Home                                       Social Determinants of Health (SDOH) Interventions    Readmission Risk Interventions No flowsheet data found.

## 2019-12-11 NOTE — Progress Notes (Signed)
PROGRESS NOTE    Jason Barajas  XLK:[id number] DOB: 01/06/1966 DOA: 12/01/2019 PCP: Ronita Hipps, MD   Brief Narrative:  Jason Barajas is a 54 y.o. male with medical history significant of anxiety, depression, COPD, PE/DVT on Eliquis, GERD, hypertension, hyperlipidemia, IBS presenting to the ED with altered mental status. Patient is very somnolent and no history could be obtained from him. History provided by wife at bedside who states that yesterday patient had an unwitnessed fall. Then again today he had another unwitnessed fall. States this evening patient was acting normal and talking to her and another neighbor. Then all of a sudden his speech became slurred and he became unresponsive.  His eyes were still open and he did not lose consciousness.  He takes Xanax 1 mg up to 5 times a day for anxiety.  He also takes gabapentin for panic attacks.  States she fill out his pillbox and keeps the rest of the medication with her. She is not sure how much Xanax he took today. Wife states patient has depression for which he takes citalopram and is seen by a psychiatrist. States patient has not expressed any suicidal thoughts. States he has chronic shortness of breath due to COPD but has not had any fevers, cough, nausea, vomiting, or diarrhea.  He has not complained of any abdominal pain.  Wife confirms that he does not drink any alcohol.  He has been vaccinated against Covid. In ED: Initially code stroke was activated and patient was seen by neurology.  TPA not given as he is on Eliquis.  Head CT normal.  CT angiogram head and neck negative for LVO.  Brain MRI normal. WBC 8.3, hemoglobin 14.7, hematocrit 46.2, platelet 187K.  Sodium 136, potassium 3.9, chloride 101, bicarb 23, BUN 8, creatinine 0.7, glucose 78.  LFTs normal.  INR 1.1.  UA not suggestive of infection.  UDS positive for benzodiazepines (patient takes Xanax at home).  SARS-CoV-2 PCR test negative.  Influenza panel negative.  Blood ethanol  level undetectable.  Salicylate and acetaminophen levels pending.  Chest x-ray negative for acute cardiopulmonary abnormality.  CT C-spine negative for acute osseous abnormality.  EKG without acute changes. ABG with pH 7.33, PCO2 54, PO2 60.  He was placed on BiPAP briefly which did not improve his mental status. Patient received 1 L IV fluid bolus. Hospitalist called to admit.  Patient admitted as above initially concern to be a stroke rule out subsequently noted to be polypharmacy in the setting of Xanax and Soma.  Patient was given flumazenil at intake with minimal improvement likely in the setting of Soma ingestion.  Patient's home medications were held at intake appropriately due to concern for polypharmacy, MRI and EEG initially negative.  Patient's mental status did not improve over the next 48 hours concerning for withdrawal symptoms as such patient was placed on low-dose benzos with moderate improvement in mental status.  Unfortunately over the next 48 hours patient would have moderate swelling and mental status with repeat MRI and EEG unremarkable.  At this point we are attempting to titrate patient's benzodiazepine dose given concern for waxing and waning symptoms likely in the setting of benzo withdrawals and previous intoxication.  Wife at bedside understands patient's precarious situation of essentially being on lifelong benzodiazepines and likely quite dependent on them while we attempt to wean patient down or off benzos if possible during his hospitalization.   Assessment & Plan:   Principal Problem:   AMS (altered mental status) Active Problems:  COPD (chronic obstructive pulmonary disease) ([place])   Fall   History of pulmonary embolism   Depression   Polypharmacy   Withdrawal from benzodiazepine ([place])   Acute metabolic encephalopathy, likely multifactorial, markedly labile Likely polypharmacy/accidental overdose with subsequent withdrawal No acute intracranial process noted -  Patient's mental status continues to stabilize 24 hours, patient is much more awake alert oriented this morning, not as sharp or quick to respond as I would expect at his age but markedly improving compared to the weekend. - Neurology previously following, appreciate insight and recommendations - CT head neck MRI without acute findings/process at intake, repeat MRI unremarkable - Initial EEG negative for seizure-like activity, repeat EEG also unremarkable  Unwitnessed falls, prior to admission:  - Likely acute on chronic ambulatory dysfunction -PT continues to follow. Certainly if patient continues to improve drastically over the next 24 hours would consider discharge home with home health and 24-hour supervision versus SNF.  Appreciate PTs insight and recommendations in this.  Anxiety, depression -Continue home citalopram -Continue Librium taper and CIWA protocol Ativan as above -Recommend discontinuing all benzodiazepines at home, close follow-up with psychiatrist in the next 1 to 2 weeks as scheduled to further evaluate dose changing with citalopram or new medication given we are weaning patient off of benzos.  COPD, stable:  -No acute exacerbation, given minimally elevated CO2 at admission at 64 and only mildly acidotic - would presume his CO2 baseline is around 50  History of PE/DVT -Continue Eliquis  DVT prophylaxis:  Continue Eliquis Code Status: Full code Family Communication: Wife at bedside  Status is: Inpatient  Dispo: The patient is from: Home              Anticipated d/c is to: To be determined - likely SNF versus home health with 24-hour supervision              Anticipated d/c date is: 24-48 hours pending clinical course and further findings              Patient currently not medically stable for discharge given ongoing mental status changes from baseline ambulatory dysfunction, unsafe discharge at this point unless we can confirm 24-hour supervision at home versus SNF  placement..  Consultants:   Neurology  Procedures:   EEG x2, echocardiogram  Antimicrobials:  Not indicated  Subjective: No acute issues or events overnight, patient awake alert oriented, markedly improving from previous, denies nausea, vomiting, diarrhea, constipation, headache, fevers, chills..  Objective: Vitals:   12/10/19 2325 12/11/19 0345 12/11/19 0721 12/11/19 0745  BP: (!) 133/54 132/71 (!) 144/76   Pulse: 77 96 67   Resp: 19 18 17    Temp: 98.6 F (37 C) 98.7 F (37.1 C) 98.2 F (36.8 C)   TempSrc: Oral Oral Oral   SpO2: 95% 97% 95% 95%  Weight:      Height:       No intake or output data in the 24 hours ending 12/11/19 0806 Filed Weights   12/04/19 1120  Weight: 111.1 kg    Examination:  General: Resting comfortably in bed eating breakfast, mild tremors in the hands but otherwise AO x4 HEENT:  Normocephalic atraumatic.  Sclerae nonicteric, noninjected.  Extraocular movements intact bilaterally. Neck:  Without mass or deformity.  Trachea is midline. Lungs:  Clear to auscultate bilaterally without rhonchi, wheeze, or rales. Heart:  Regular rate and rhythm.  Without murmurs, rubs, or gallops. Abdomen:  Soft, nontender, nondistended.  Without guarding or rebound. Extremities: Without cyanosis, clubbing, edema,  or obvious deformity. Neuro: Resting tremor bilateral upper extremities worse with intention without overt deficit. Vascular:  Dorsalis pedis and posterior tibial pulses palpable bilaterally. Skin:  Warm and dry, no erythema, no ulcerations.  Data Reviewed: I have personally reviewed following labs and imaging studies  CBC: Recent Labs  Lab 12/04/19 1007 12/05/19 0441 12/06/19 0354 12/07/19 0519  WBC 11.8* 8.3 9.1 8.3  HGB 15.2 13.9 14.3 14.7  HCT 46.6 43.7 43.7 44.8  MCV 94.9 97.8 95.0 94.9  PLT 210 201 201 443   Basic Metabolic Panel: Recent Labs  Lab 12/04/19 1007 12/05/19 0441 12/06/19 0354 12/07/19 0519  NA 137 139 136 136  K  4.0 3.9 4.5 3.6  CL 102 103 99 98  CO2 20* 23 23 25   GLUCOSE 96 90 89 99  BUN 10 15 13 11   CREATININE 0.85 0.76 0.73 0.70  CALCIUM 8.9 8.7* 8.9 9.1   GFR: Estimated Creatinine Clearance: 135.9 mL/min (by C-G formula based on SCr of 0.7 mg/dL). Liver Function Tests: Recent Labs  Lab 12/04/19 1007  AST 20  ALT 16  ALKPHOS 70  BILITOT 1.4*  PROT 6.8  ALBUMIN 3.5   No results for input(s): LIPASE, AMYLASE in the last 168 hours. Recent Labs  Lab 12/04/19 1541  AMMONIA 26   Coagulation Profile: No results for input(s): INR, PROTIME in the last 168 hours. Cardiac Enzymes: No results for input(s): CKTOTAL, CKMB, CKMBINDEX, TROPONINI in the last 168 hours. BNP (last 3 results) No results for input(s): PROBNP in the last 8760 hours. HbA1C: No results for input(s): HGBA1C in the last 72 hours. CBG: Recent Labs  Lab 12/04/19 0809 12/09/19 0909  GLUCAP 88 95   Lipid Profile: No results for input(s): CHOL, HDL, LDLCALC, TRIG, CHOLHDL, LDLDIRECT in the last 72 hours. Thyroid Function Tests: No results for input(s): TSH, T4TOTAL, FREET4, T3FREE, THYROIDAB in the last 72 hours. Anemia Panel: No results for input(s): VITAMINB12, FOLATE, FERRITIN, TIBC, IRON, RETICCTPCT in the last 72 hours. Sepsis Labs: No results for input(s): PROCALCITON, LATICACIDVEN in the last 168 hours.  Recent Results (from the past 240 hour(s))  Respiratory Panel by RT PCR (Flu A&B, Covid) - Nasopharyngeal Swab     Status: None   Collection Time: 12/01/19  7:28 PM   Specimen: Nasopharyngeal Swab  Result Value Ref Range Status   SARS Coronavirus 2 by RT PCR NEGATIVE NEGATIVE Final    Comment: (NOTE) SARS-CoV-2 target nucleic acids are NOT DETECTED.  The SARS-CoV-2 RNA is generally detectable in upper respiratoy specimens during the acute phase of infection. The lowest concentration of SARS-CoV-2 viral copies this assay can detect is 131 copies/mL. A negative result does not preclude  SARS-Cov-2 infection and should not be used as the sole basis for treatment or other patient management decisions. A negative result may occur with  improper specimen collection/handling, submission of specimen other than nasopharyngeal swab, presence of viral mutation(s) within the areas targeted by this assay, and inadequate number of viral copies (<131 copies/mL). A negative result must be combined with clinical observations, patient history, and epidemiological information. The expected result is Negative.  Fact Sheet for Patients:  PinkCheek.be  Fact Sheet for Healthcare Providers:  GravelBags.it  This test is no t yet approved or cleared by the [place] FDA and  has been authorized for detection and/or diagnosis of SARS-CoV-2 by FDA under an Emergency Use Authorization (EUA). This EUA will remain  in effect (meaning this test can be used) for the duration  of the COVID-19 declaration under Section 564(b)(1) of the Act, 21 U.S.C. section 360bbb-3(b)(1), unless the authorization is terminated or revoked sooner.     Influenza A by PCR NEGATIVE NEGATIVE Final   Influenza B by PCR NEGATIVE NEGATIVE Final    Comment: (NOTE) The Xpert Xpress SARS-CoV-2/FLU/RSV assay is intended as an aid in  the diagnosis of influenza from Nasopharyngeal swab specimens and  should not be used as a sole basis for treatment. Nasal washings and  aspirates are unacceptable for Xpert Xpress SARS-CoV-2/FLU/RSV  testing.  Fact Sheet for Patients: PinkCheek.be  Fact Sheet for Healthcare Providers: GravelBags.it  This test is not yet approved or cleared by the [place] FDA and  has been authorized for detection and/or diagnosis of SARS-CoV-2 by  FDA under an Emergency Use Authorization (EUA). This EUA will remain  in effect (meaning this test can be used) for the duration of the   Covid-19 declaration under Section 564(b)(1) of the Act, 21  U.S.C. section 360bbb-3(b)(1), unless the authorization is  terminated or revoked. Performed at [facility], [place] [address], [place], [place] [address]   Culture, blood (Routine X 2) w Reflex to ID Panel     Status: None   Collection Time: 12/04/19  9:48 PM   Specimen: BLOOD  Result Value Ref Range Status   Specimen Description BLOOD LEFT ANTECUBITAL  Final   Special Requests   Final    BOTTLES DRAWN AEROBIC AND ANAEROBIC Blood Culture results may not be optimal due to an inadequate volume of blood received in culture bottles   Culture   Final    NO GROWTH 5 DAYS Performed at [facility], [place] [address], [place], [place] [address]    Report Status 12/09/2019 FINAL  Final  Culture, blood (Routine X 2) w Reflex to ID Panel     Status: None   Collection Time: 12/04/19  9:53 PM   Specimen: BLOOD RIGHT HAND  Result Value Ref Range Status   Specimen Description BLOOD RIGHT HAND  Final   Special Requests   Final    BOTTLES DRAWN AEROBIC ONLY Blood Culture adequate volume   Culture   Final    NO GROWTH 5 DAYS Performed at [facility], [place] [address], [place], [place] [address]    Report Status 12/09/2019 FINAL  Final    Radiology Studies: No results found.  Scheduled Meds: . apixaban  5 mg Oral BID  . chlordiazePOXIDE  10 mg Oral QID   Followed by  . [START ON 12/14/2019] chlordiazePOXIDE  5 mg Oral QID  . citalopram  20 mg Oral Daily   And  . citalopram  40 mg Oral QHS  . folic acid  1 mg Oral Daily  . hydrochlorothiazide  12.5 mg Oral Daily  . irbesartan  150 mg Oral Daily  . mometasone-formoterol  2 puff Inhalation BID  . multivitamin with minerals  1 tablet Oral Daily  . pantoprazole  40 mg Oral Daily  . simvastatin  20 mg Oral q1800  . sodium chloride flush  3 mL Intravenous Q12H   Continuous Infusions:    LOS: 9 days   Time spent: 65min  Neizan C Briona Korpela, DO [facility]  If 7PM-7AM, please contact night-coverage [email or web address]  12/11/2019, 8:06 AM

## 2019-12-11 NOTE — TOC Progression Note (Signed)
Transition of Care [facility]) - Progression Note    Patient Details  Name: Jason Barajas MRN: [id number] Date of Birth: 1965/08/22  Transition [facility]) CM/SW [place], LCSW Phone Number: 12/11/2019, 12:27 PM  Clinical Narrative:    CSW received request to speak with patient regarding discharge plan. Patient was very pleasant and reported that he would like to return home with home health services and not to a SNF. He stated that due to COVID, he does not want to go to a facility as he feels he is at higher risk. He reported that he has been walking better and will sign out AMA tonight when his wife comes to the hospital if necessary. Patient reported that he would like home health services.  Patient reports preference for an agency in-network with his [facility] insurance. Alvis Lemmings has accepted patient for [facility]. CSW discussed equipment needs with patient and he denied needs, stating that he has a walker at home already. CSW confirmed PCP and address with patient: [address] [place], [place] [address]. Patient states his wife will come pick him up at discharge.      Expected Discharge Plan: [place] Barriers to Discharge: Continued Medical Work up  Expected Discharge Plan and Services Expected Discharge Plan: [place] In-house Referral: Clinical Social Work Discharge Planning Services: CM Consult Post Acute Care Choice: [place] Living arrangements for the past 2 months: [place]: PT [place]: [place] Date [place]: 12/11/19 Time [place]: 1227 Representative spoke with at [place]: [place] ([place]) Interventions    Readmission Risk Interventions No flowsheet data found.

## 2019-12-11 NOTE — Progress Notes (Signed)
Physical Therapy Treatment Patient Details Name: Jason Barajas MRN: [id number] DOB: February 01, 1966 Today's Date: 12/11/2019    History of Present Illness Patient is a 54 year old male who presented to the hospital with AMS. He fell 2x on 12/01/19 and became unresponsive. CT and MRI of head were negaitive. Concern for benzo withrawal and acute metabolic encephalopathy. Medical hx of the following: clotting disorder, colon polyps, COPD, DVT, HTN, and pulmonary embolism.    PT Comments    Patient progressing well towards PT goals. Pt alert and appropriate with conversation today. Able to state he does not want to go to rehab and vaguely remembers this therapist from last week. Improved ambulation distance with Min guard assist and use of RW for support. Tolerated short distance ambulation within room without DME and tolerated well. Pt with very slow gait speed putting pt at increased risk for falls. Cognition seems to be improving but continues to be flat, follows commands well. If pt continues to progress will be able to d/c home with support. Encouraged increasing activity with nursing while in the hospital. Will follow.   Follow Up Recommendations  SNF;Supervision/Assistance - 24 hour (likely progress to [facility])     Equipment Recommendations  None recommended by PT    Recommendations for Other Services       Precautions / Restrictions Precautions Precautions: Fall Restrictions Weight Bearing Restrictions: No    Mobility  Bed Mobility               General bed mobility comments: Sitting EOB upon PT arrival.  Transfers Overall transfer level: Needs assistance Equipment used: Rolling walker (2 wheeled) Transfers: Sit to/from Stand Sit to Stand: Min guard         General transfer comment: Min guard for safety. Stood from [employer], transferred to chair post ambulation.  Ambulation/Gait Ambulation/Gait assistance: Min guard Gait Distance (Feet): 60 Feet Assistive device:  Rolling walker (2 wheeled) Gait Pattern/deviations: Step-through pattern;Decreased stride length;Decreased step length - right;Decreased step length - left Gait velocity: very slow   General Gait Details: Very, very slow steady gait with guarded posture/head. Use of RW for support; ambulated in room without DME and required Min guard for safety holdin rail as needed.   Stairs             Wheelchair Mobility    Modified Rankin (Stroke Patients Only)       Balance Overall balance assessment: Needs assistance Sitting-balance support: Feet supported;No upper extremity supported Sitting balance-Leahy Scale: Good     Standing balance support: During functional activity Standing balance-Leahy Scale: Fair Standing balance comment: Close min guard during static and dynamic standing, doesd better with UE support for dynamic standing.                            Cognition Arousal/Alertness: Awake/alert Behavior During Therapy: WFL for tasks assessed/performed Overall Cognitive Status: Impaired/Different from baseline Area of Impairment: Attention                   Current Attention Level: Sustained   Following Commands: Follows one step commands consistently     Problem Solving: Slow processing;Requires verbal cues General Comments: A&0X4 somewhat oriented to situation, reporting he does not want to go to rehab. Vaguely remembers therapist from last week. Somewhat flat. Better probelm solving today.      Exercises      General Comments  Pertinent Vitals/Pain Pain Assessment: No/denies pain    Home Living                      Prior Function            PT Goals (current goals can now be found in the care plan section) Progress towards PT goals: Progressing toward goals    Frequency    Min 3X/week      PT Plan Current plan remains appropriate    Co-evaluation              AM-PAC PT "6 Clicks" Mobility   Outcome  Measure  Help needed turning from your back to your side while in a flat bed without using bedrails?: A Little Help needed moving from lying on your back to sitting on the side of a flat bed without using bedrails?: A Little Help needed moving to and from a bed to a chair (including a wheelchair)?: A Little Help needed standing up from a chair using your arms (e.g., wheelchair or bedside chair)?: A Little Help needed to walk in hospital room?: A Little Help needed climbing 3-5 steps with a railing? : A Little 6 Click Score: 18    End of Session Equipment Utilized During Treatment: Gait belt Activity Tolerance: Patient tolerated treatment well Patient left: in chair;with call bell/phone within reach;with chair alarm set Nurse Communication: Mobility status PT Visit Diagnosis: Unsteadiness on feet (R26.81);Muscle weakness (generalized) (M62.81);Difficulty in walking, not elsewhere classified (R26.2);Other symptoms and signs involving the nervous system (R29.898);Other abnormalities of gait and mobility (R26.89)     Time: 0938-1829 PT Time Calculation (min) (ACUTE ONLY): 24 min  Charges:  $Gait Training: 23-37 mins                     Marisa Severin, PT, DPT Acute Rehabilitation Services Pager [phone] Office [phone]       [place] 12/11/2019, 8:55 AM

## 2019-12-12 MED ORDER — ADULT MULTIVITAMIN W/MINERALS CH: 1.0000 | ORAL_TABLET | Freq: Every day | ORAL | 0 refills | Status: AC

## 2019-12-12 MED ORDER — CHLORDIAZEPOXIDE HCL 5 MG PO CAPS: 5.0000 mg | ORAL_CAPSULE | Freq: Four times a day (QID) | ORAL | 0 refills | Status: AC

## 2019-12-12 MED ORDER — FOLIC ACID 1 MG PO TABS: 1.0000 mg | ORAL_TABLET | Freq: Every day | ORAL | 0 refills | Status: AC

## 2019-12-12 MED ORDER — CHLORDIAZEPOXIDE HCL 10 MG PO CAPS: 10.0000 mg | ORAL_CAPSULE | Freq: Four times a day (QID) | ORAL | 0 refills | Status: AC

## 2019-12-12 NOTE — Progress Notes (Signed)
Patient and wife Joelene Millin) given discharge instructions and medication prescription. Both patient and wife verbalized understanding. Pt going home with HH. All patient belongings taken with patient.

## 2019-12-12 NOTE — TOC Transition Note (Signed)
Transition of Care [facility]) - CM/SW Discharge Note   Patient Details  Name: Jason Barajas MRN: [id number] Date of Birth: 1965-12-03  [facility]) CM/SW Contact:  Carles Collet, RN Phone Number: 12/12/2019, 10:58 AM   Clinical Narrative:    Kaylyn Layer of DC today    Final next level of care: Home w Home Health Services Barriers to Discharge: Continued Medical Work up   Patient Goals and CMS Choice   CMS Medicare.gov Compare Post Acute Care list provided to:: Patient Represenative (must comment) Choice offered to / list presented to : Spouse  Discharge Placement                       Discharge Plan and Services In-house Referral: Clinical Social Work Discharge Planning Services: CM Consult Post Acute Care Choice: [place]: PT [facility] Agency: [place] Date [place]: 12/11/19 Time [place]: 1227 Representative spoke with at [place]: [place] Determinants of Health ([place]) Interventions     Readmission Risk Interventions No flowsheet data found.

## 2019-12-12 NOTE — Progress Notes (Signed)
Occupational Therapy Treatment Patient Details Name: Jason Barajas MRN: [id number] DOB: Nov 06, 1965 Today's Date: 12/12/2019    History of present illness Patient is a 54 year old male who presented to the hospital with AMS. He fell 2x on 12/01/19 and became unresponsive. CT and MRI of head were negaitive. Concern for benzo withrawal and acute metabolic encephalopathy. Medical hx of the following: clotting disorder, colon polyps, COPD, DVT, HTN, and pulmonary embolism.   OT comments  Patient continues to make steady progress towards goals in skilled OT session. Patient's session encompassed functional ambulation and ADLs at sink. Pt making marked improvement in self care, following commands, and initiation of task, however continues to require min verbal cues in order to sequence basic self care tasks appropriately. Therapy will continue to follow acutely.   Follow Up Recommendations  SNF    Equipment Recommendations  None recommended by OT    Recommendations for Other Services      Precautions / Restrictions Precautions Precautions: Fall Restrictions Weight Bearing Restrictions: No       Mobility Bed Mobility               General bed mobility comments: In bathroom upon arrival  Transfers Overall transfer level: Needs assistance Equipment used: Rolling walker (2 wheeled) Transfers: Sit to/from Stand Sit to Stand: Min guard         General transfer comment: Min gaurd for safety    Balance Overall balance assessment: Needs assistance Sitting-balance support: Feet supported;No upper extremity supported Sitting balance-Leahy Scale: Good     Standing balance support: During functional activity Standing balance-Leahy Scale: Fair Standing balance comment: Standing at sink washing face without difficulty.                           ADL either performed or assessed with clinical judgement   ADL Overall ADL's : Needs assistance/impaired     Grooming:  Wash/dry hands;Wash/dry face;Oral care;Min guard Grooming Details (indicate cue type and reason): min verbal cues to intiate tasks in appropriate order                 Toilet Transfer: Minimal assistance;RW;Ambulation [employer] Details (indicate cue type and reason): Recieved on toilet, wife assisted to bathroom Toileting- Clothing Manipulation and Hygiene: Set up [place] Details (indicate cue type and reason): Able to wipe with toilet paper and washcloth for thoroughness     Functional mobility during ADLs: Min guard;Cueing for safety;Cueing for sequencing General ADL Comments: Min guard for safety and sequencing     Vision       Perception     Praxis      Cognition Arousal/Alertness: Awake/alert Behavior During Therapy: WFL for tasks assessed/performed Overall Cognitive Status: Impaired/Different from baseline Area of Impairment: Problem solving                             Problem Solving: Slow processing;Requires verbal cues;Decreased initiation;Difficulty sequencing General Comments: Delays noted in basic ADL tasks, required cues from wife        Exercises     Shoulder Instructions       General Comments Wife present during session. Performed 5xSTS in 27.6 seconds (normative <11.6 sec) indicating decreased functional strength, fall risk and impaired balance.    Pertinent Vitals/ Pain       Pain Assessment: No/denies pain  Home Living  Prior Functioning/Environment              Frequency  Min 2X/week        Progress Toward Goals  OT Goals(current goals can now be found in the care plan section)  Progress towards OT goals: Progressing toward goals  Acute Rehab OT Goals Patient Stated Goal: home OT Goal Formulation: With patient Time For Goal Achievement: 12/18/19 Potential to Achieve Goals: Good  Plan Discharge plan remains appropriate     Co-evaluation                 AM-PAC OT "6 Clicks" Daily Activity     Outcome Measure   Help from another person eating meals?: None Help from another person taking care of personal grooming?: None Help from another person toileting, which includes using toliet, bedpan, or urinal?: A Little Help from another person bathing (including washing, rinsing, drying)?: A Little Help from another person to put on and taking off regular upper body clothing?: None Help from another person to put on and taking off regular lower body clothing?: A Little 6 Click Score: 21    End of Session Equipment Utilized During Treatment: Rolling walker  OT Visit Diagnosis: Unsteadiness on feet (R26.81);Muscle weakness (generalized) (M62.81);Other symptoms and signs involving cognitive function   Activity Tolerance Patient tolerated treatment well   Patient Left  (left up at sink and handed off to PT for session)   Nurse Communication Mobility status        Time: 1000-1016 OT Time Calculation (min): 16 min  Charges: OT General Charges $OT Visit: 1 Visit OT Treatments $Self Care/Home Management : 8-22 mins  Corinne Ports E. Hemi Chacko, COTA/L Acute Rehabilitation Services [place] 12/12/2019, 12:44 PM

## 2019-12-12 NOTE — Progress Notes (Signed)
Physical Therapy Treatment Patient Details Name: Jason Barajas MRN: [id number] DOB: 1965-04-16 Today's Date: 12/12/2019    History of Present Illness Patient is a 54 year old male who presented to the hospital with AMS. He fell 2x on 12/01/19 and became unresponsive. CT and MRI of head were negaitive. Concern for benzo withrawal and acute metabolic encephalopathy. Medical hx of the following: clotting disorder, colon polyps, COPD, DVT, HTN, and pulmonary embolism.    PT Comments    Patient progressing well towards PT goals. Tolerated gait training today without use of RW for support. Noted to have a steady but guarded gait. Slow gait speed at .86 ft/sec putting pt at increased risk for falls. Pt with significant improvement on 5xSTS test improving by >4.2 seconds (improved from ~35 to  ~27 seconds) indicating improved functional strength, balance and fall risk. Tolerated stair training with supervision for safety. Pt will have support from wife at home. Discharge recommendation updated to home with supervision. Will follow.   Follow Up Recommendations  Supervision for mobility/OOB;No PT follow up     Equipment Recommendations  None recommended by PT    Recommendations for Other Services       Precautions / Restrictions Precautions Precautions: Fall Restrictions Weight Bearing Restrictions: No    Mobility  Bed Mobility               General bed mobility comments: Standing at the sink upon PT arrival.  Transfers Overall transfer level: Needs assistance Equipment used: None Transfers: Sit to/from Stand Sit to Stand: Supervision         General transfer comment: Supervision for safety.  Ambulation/Gait Ambulation/Gait assistance: Supervision Gait Distance (Feet): 120 Feet Assistive device: None Gait Pattern/deviations: Step-through pattern;Decreased stride length Gait velocity: .86 ft/sec Gait velocity interpretation: <1.31 ft/sec, indicative of household  ambulator General Gait Details: Slow guarded and steady gait. Did not use RW for support today.   Stairs Stairs: Yes Stairs assistance: Supervision Stair Management: Alternating pattern;Two rails Number of Stairs: 5 (x2 bouts) General stair comments: Cues for technique and safety.   Wheelchair Mobility    Modified Rankin (Stroke Patients Only)       Balance Overall balance assessment: Needs assistance Sitting-balance support: Feet supported;No upper extremity supported Sitting balance-Leahy Scale: Good     Standing balance support: During functional activity Standing balance-Leahy Scale: Fair Standing balance comment: Standing at sink washing face without difficulty.                            Cognition Arousal/Alertness: Awake/alert Behavior During Therapy: WFL for tasks assessed/performed Overall Cognitive Status: Impaired/Different from baseline Area of Impairment: Problem solving                             Problem Solving: Slow processing;Requires verbal cues General Comments: Reports being somewhat slower      Exercises      General Comments General comments (skin integrity, edema, etc.): Wife present during session. Performed 5xSTS in 27.6 seconds (normative <11.6 sec) indicating decreased functional strength, fall risk and impaired balance.      Pertinent Vitals/Pain Pain Assessment: No/denies pain    Home Living                      Prior Function            PT Goals (current goals can now be  found in the care plan section) Progress towards PT goals: Progressing toward goals    Frequency    Min 3X/week      PT Plan Discharge plan needs to be updated    Co-evaluation              AM-PAC PT "6 Clicks" Mobility   Outcome Measure  Help needed turning from your back to your side while in a flat bed without using bedrails?: None Help needed moving from lying on your back to sitting on the side of a  flat bed without using bedrails?: None Help needed moving to and from a bed to a chair (including a wheelchair)?: None Help needed standing up from a chair using your arms (e.g., wheelchair or bedside chair)?: A Little Help needed to walk in hospital room?: A Little Help needed climbing 3-5 steps with a railing? : A Little 6 Click Score: 21    End of Session Equipment Utilized During Treatment: Gait belt Activity Tolerance: Patient tolerated treatment well Patient left: in bed;with call bell/phone within reach;with family/visitor present (sitting EOB) Nurse Communication: Mobility status PT Visit Diagnosis: Unsteadiness on feet (R26.81);Muscle weakness (generalized) (M62.81);Difficulty in walking, not elsewhere classified (R26.2);Other symptoms and signs involving the nervous system (R29.898);Other abnormalities of gait and mobility (R26.89)     Time: 6606-0045 PT Time Calculation (min) (ACUTE ONLY): 21 min  Charges:  $Gait Training: 8-22 mins                     Marisa Severin, PT, DPT Acute Rehabilitation Services Pager [phone] Office [place] 12/12/2019, 10:45 AM

## 2019-12-12 NOTE — Discharge Summary (Signed)
Physician Discharge Summary  Jason Barajas EVO:[id number] DOB: October 05, 1965 DOA: 12/01/2019  PCP: Ronita Hipps, MD  Admit date: 12/01/2019 Discharge date: 12/12/2019  Admitted From: Home Disposition: Home  Recommendations for Outpatient Follow-up:  1. Follow up with PCP in 1-2 weeks 2. Psychiatry in the next 1 to 2 weeks as scheduled  Home Health: PT Equipment/Devices: None  Discharge Condition: Stable CODE STATUS: Full Diet recommendation: Low-carb low-fat low-salt diet  Brief/Interim Summary: Jason Barajas a 54 y.o.malewith medical history significant ofanxiety, depression, COPD, PE/DVT on Eliquis, GERD, hypertension, hyperlipidemia, IBS presenting to the ED with altered mental status.Patient isvery somnolent and no history could be obtained from him. History provided by wife at bedside who states that yesterday patient had an unwitnessed fall. Then again today he had another unwitnessed fall. States this evening patient was acting normal and talking to her and another neighbor. Then all of a sudden his speech became slurred and he became unresponsive.His eyes were still open and he did not lose consciousness. He takes Xanax 1 mg up to 5 times a day for anxiety. He also takes gabapentin for panic attacks. States she fillout his pillbox and keeps the rest of the medication with her. She is not sure how much Xanax he took today.Wife states patient has depression for which he takes citalopram and is seen by a psychiatrist.States patient has not expressed any suicidal thoughts. Stateshe has chronic shortness of breath due to COPD but has not had any fevers, cough, nausea, vomiting, or diarrhea.He has not complained of any abdominal pain. Wife confirms that he does not drink any alcohol. He has been vaccinated against Covid. In ED: Initially code stroke was activated and patient was seen by neurology. TPA not given as he is on Eliquis. Head CT normal. CT angiogram head and  neck negative for LVO. Brain MRI normal. WBC 8.3, hemoglobin 14.7, hematocrit 46.2, platelet 187K.Sodium 136, potassium 3.9, chloride 101, bicarb 23, BUN 8, creatinine 0.7, glucose 78. LFTs normal. INR 1.1. UA not suggestive of infection. UDS positive for benzodiazepines (patient takes Xanax at home). SARS-CoV-2 PCR test negative. Influenza panel negative. Blood ethanol level undetectable. Salicylate and acetaminophen levels pending. Chest x-ray negative for acute cardiopulmonary abnormality. CT C-spine negative for acute osseous abnormality. EKG without acute changes. ABG with pH 7.33, PCO2 54, PO2 60. He was placed on BiPAP briefly which did not improve his mental status. Patient received 1 L IV fluid bolus. Hospitalist called to admit. Patient admitted as above initially concern to be a stroke rule out subsequently noted to be polypharmacy in the setting of Xanax and Soma.  Patient was given flumazenil at intake with minimal improvement likely in the setting of Soma ingestion.  Patient's home medications were held at intake appropriately due to concern for polypharmacy, MRI and EEG initially negative.  Patient's mental status did not improve over the next 48 hours concerning for withdrawal symptoms as such patient was placed on low-dose benzos with moderate improvement in mental status.  Unfortunately over the next 48 hours patient would have moderate swelling and mental status with repeat MRI and EEG unremarkable.  At this point we are attempting to titrate patient's benzodiazepine dose given concern for waxing and waning symptoms likely in the setting of benzo withdrawals and previous intoxication.  Wife at bedside understands patient's precarious situation of essentially being on lifelong benzodiazepines and likely quite dependent on them while we attempt to wean patient down or off benzos if possible during his hospitalization.  Patient presented as above with acute metabolic encephalopathy  likely multifactorial in etiology ultimately polypharmacy is the primary driving factor.  Patient had MRI and EEG x2 given concern for possible strokelike etiology given speech discrepancy and weaknesses but again this was continued to be polypharmacy with withdrawals, certainly acute seizure in the setting of withdrawal would have been reasonable however EEG was negative x2.  Patient is on chronic benzos with additional carisoprodol more acutely prescribed for muscle aches and pain.  Patient received flumazenil at admission and subsequently went into likely withdrawal from lifelong benzodiazepine use.  Subsequently over the next few days patient had a waxing and waning mental status in the setting of withdrawal and likely polypharmacy given his combative nature overnight he received Haldol at least once complicating his stay and mental status.  Once patient was finally stabilized on Librium taper with CIWA protocol he improved drastically.  Patient evaluated by PT OT initially recommending SNF placement due to mental status and instability however as patient's mental status improved on Librium taper he was deemed appropriate for discharge home with 24-hour care and home health PT.  At this time lengthy discussion with wife about discontinuation of benzodiazepines at home, he will continue his Librium taper for the next 6 days as described and follow closely with PCP and psychiatrist for further evaluation and treatment.  He does remain on his home citalopram, we discussed that he should not change or alter any of his other chronic medications however he should discontinue the Xanax given above.  Patient otherwise stable and agreeable for discharge home.  Discharge Diagnoses:  Principal Problem:   AMS (altered mental status) Active Problems:   COPD (chronic obstructive pulmonary disease) ([place])   Fall   History of pulmonary embolism   Depression   Polypharmacy   Withdrawal from benzodiazepine  [facility])    Discharge Instructions  Discharge Instructions    Call MD for:   Complete by: As directed    Worsening confusion, tremors/shakes.   Call MD for:  difficulty breathing, headache or visual disturbances   Complete by: As directed    Call MD for:  persistant dizziness or light-headedness   Complete by: As directed    Diet - low sodium heart healthy   Complete by: As directed    Increase activity slowly   Complete by: As directed      Allergies as of 12/12/2019      Reactions   Penicillins Other (See Comments)   Unknown childhood reaction   Codeine Rash      Medication List    STOP taking these medications   ALPRAZolam 1 MG tablet Commonly known as: XANAX   gabapentin 600 MG tablet Commonly known as: NEURONTIN     TAKE these medications   acetaminophen 500 MG tablet Commonly known as: TYLENOL Take 500 mg by mouth every 6 (six) hours as needed for headache (pain).   ProAir HFA 108 (90 Base) MCG/ACT inhaler Generic drug: albuterol Inhale 2 puffs into the lungs every 6 (six) hours as needed for wheezing or shortness of breath.   albuterol (2.5 MG/3ML) 0.083% nebulizer solution Commonly known as: PROVENTIL Take 2.5 mg by nebulization every 4 (four) hours as needed for wheezing or shortness of breath.   chlordiazePOXIDE 10 MG capsule Commonly known as: LIBRIUM Take 1 capsule (10 mg total) by mouth 4 (four) times daily for 2 days.   chlordiazePOXIDE 5 MG capsule Commonly known as: LIBRIUM Take 1 capsule (5 mg total) by mouth  4 (four) times daily for 4 days. Start taking on: December 14, 2019   citalopram 40 MG tablet Commonly known as: CELEXA Take 20-40 mg by mouth See admin instructions. Take 1/2 tablet (20 mg) by mouth every morning and 1 tablet (40 mg) at night   CLEAR EYES OP Place 1 drop into both eyes daily as needed (dry eyes/irritation).   Eliquis 2.5 MG Tabs tablet Generic drug: apixaban Take 2.5 mg by mouth 2 (two) times daily.   folic acid  1 MG tablet Commonly known as: FOLVITE Take 1 tablet (1 mg total) by mouth daily.   multivitamin with minerals Tabs tablet Take 1 tablet by mouth daily.   olmesartan-hydrochlorothiazide 20-12.5 MG tablet Commonly known as: BENICAR HCT Take 1 tablet by mouth daily.   pantoprazole 40 MG tablet Commonly known as: PROTONIX Take 40 mg by mouth daily.   simvastatin 20 MG tablet Commonly known as: ZOCOR Take 20 mg by mouth daily.   Symbicort 160-4.5 MCG/ACT inhaler Generic drug: budesonide-formoterol Inhale 2 puffs into the lungs 2 (two) times daily.       Follow-up Information    [facility] [place] Follow up.   Specialty: Home Health Services Why: Home Health PT services arranged. They will contact you about 48 hours after discharge to arrange visit.  Contact information: [place] [id number] [phone]              Allergies  Allergen Reactions  . Penicillins Other (See Comments)    Unknown childhood reaction  . Codeine Rash    Consultations: Neurology  Procedures/Studies: EEG  Result Date: 12/02/2019 Lora Havens, MD     12/02/2019  3:10 PM Patient Name: Jason Barajas MRN: [id number] Epilepsy Attending: Lora Havens Referring Physician/Provider: Dr Derrick Ravel Date: 12/02/2019 Duration: 29.29 mins Patient history: 54yo M with ams. EEG to evaluate for seizure. Level of alertness: Awake AEDs during EEG study: None Technical aspects: This EEG study was done with scalp electrodes positioned according to the 10-20 International system of electrode placement. Electrical activity was acquired at a sampling rate of 500Hz  and reviewed with a high frequency filter of 70Hz  and a low frequency filter of 1Hz . EEG data were recorded continuously and digitally stored. Description: The posterior dominant rhythm consists of 8-9 Hz activity of moderate voltage (25-35 uV) seen predominantly in posterior head regions, symmetric and  reactive to eye opening and eye closing. Physiologic photic driving was not seen during photic stimulation.  Hyperventilation was not performed.   IMPRESSION: This study is within normal limits.No seizures or epileptiform discharges were seen throughout the recording. Lora Havens   CT Cervical Spine Wo Contrast  Result Date: 12/01/2019 CLINICAL DATA:  Neck trauma EXAM: CT CERVICAL SPINE WITHOUT CONTRAST TECHNIQUE: Multidetector CT imaging of the cervical spine was performed without intravenous contrast. Multiplanar CT image reconstructions were also generated. COMPARISON:  None. FINDINGS: Alignment: No subluxation.  Facet alignment within normal limits. Skull base and vertebrae: No acute fracture. No primary bone lesion or focal pathologic process. Soft tissues and spinal canal: No prevertebral fluid or swelling. No visible canal hematoma. Disc levels:  Mild diffuse degenerative changes at multiple levels. Upper chest: Negative. Other: None IMPRESSION: Mild degenerative changes of the cervical spine. No acute osseous abnormality. Electronically Signed   By: Donavan Foil M.D.   On: 12/01/2019 19:17   MR BRAIN WO CONTRAST  Result Date: 12/01/2019 CLINICAL DATA:  54 year old male code stroke presentation. EXAM:  MRI HEAD WITHOUT CONTRAST TECHNIQUE: Multiplanar, multiecho pulse sequences of the brain and surrounding structures were obtained without intravenous contrast. COMPARISON:  Plain head CT and CTA head and neck. Brain MRI 11/06/2004. FINDINGS: Brain: No restricted diffusion to suggest acute infarction. No midline shift, mass effect, evidence of mass lesion, ventriculomegaly, extra-axial collection or acute intracranial hemorrhage. Cervicomedullary junction and pituitary are within normal limits. Pearline Cables and white matter signal remains within normal limits throughout the brain. No convincing encephalomalacia. No chronic cerebral blood products. Vascular: Major intracranial vascular flow voids are stable  since 2006. Skull and upper cervical spine: Negative. Sinuses/Orbits: Negative. Other: Mastoids are clear. Grossly negative visible internal auditory structures, scalp and face soft tissues. IMPRESSION: Normal noncontrast MRI appearance of the brain. Electronically Signed   By: Genevie Ann M.D.   On: 12/01/2019 21:52   MR BRAIN W WO CONTRAST  Result Date: 12/04/2019 CLINICAL DATA:  Delirium. EXAM: MRI HEAD WITHOUT AND WITH CONTRAST TECHNIQUE: Multiplanar, multiecho pulse sequences of the brain and surrounding structures were obtained without and with intravenous contrast. CONTRAST:  43mL GADAVIST GADOBUTROL 1 MMOL/ML IV SOLN COMPARISON:  MRI 12/01/2019 FINDINGS: Brain: No acute infarction, hemorrhage, hydrocephalus, extra-axial collection or mass lesion. No abnormal enhancement. Vascular: Proximal arterial flow voids are maintained at the skull base. Skull and upper cervical spine: Normal marrow signal. Sinuses/Orbits: Mild ethmoid air cell mucosal thickening. No air-fluid levels. Cystic structures in the posterior nasopharynx with peripheral enhancement, likely benign mucosal retention cysts. Other: No mastoid effusions. IMPRESSION: No evidence of acute intracranial abnormality. Electronically Signed   By: Margaretha Sheffield MD   On: 12/04/2019 10:25   DG Chest Portable 1 View  Result Date: 12/01/2019 CLINICAL DATA:  Shortness of breath EXAM: PORTABLE CHEST 1 VIEW COMPARISON:  CT 09/14/2018, radiograph 09/14/2018 FINDINGS: Chronically coarsened interstitial changes towards the lung bases with apical lucency compatible with areas of emphysematous change and basilar scarring particularly within the lingula. These are not significantly changed from comparison radiography. No new consolidative opacity. The cardiomediastinal contours are unremarkable. Telemetry leads overlie the chest. No acute osseous or soft tissue abnormality. Degenerative changes are present in the imaged spine and shoulders. IMPRESSION:  Chronically coarsened interstitial changes and apical lucency compatible with areas of basilar scarring and emphysematous features better seen on comparison CT. No acute cardiopulmonary abnormality. Electronically Signed   By: Lovena Le M.D.   On: 12/01/2019 19:34   Overnight EEG with video  Result Date: 12/05/2019 Lora Havens, MD     12/05/2019 10:02 AM Patient Name: RAFIQ BUCKLIN MRN: [id number] Epilepsy Attending: Lora Havens Referring Physician/Provider: Dr Derrick Ravel Duration: 12/04/2019 1215 to 12/05/2019 0955  Patient history: 54yo M with ams. EEG to evaluate for seizure.  Level of alertness: Awake  AEDs during EEG study: None  Technical aspects: This EEG study was done with scalp electrodes positioned according to the 10-20 International system of electrode placement. Electrical activity was acquired at a sampling rate of 500Hz  and reviewed with a high frequency filter of 70Hz  and a low frequency filter of 1Hz . EEG data were recorded continuously and digitally stored.  Description: The posterior dominant rhythm consists of 8-9 Hz activity of moderate voltage (25-35 uV) seen predominantly in posterior head regions, symmetric and reactive to eye opening and eye closing. Physiologic photic driving was not seen during photic stimulation.  Hyperventilation was not performed.    IMPRESSION: This study is within normal limits. No seizures or epileptiform discharges were seen throughout the recording.  Priyanka  [name]   ECHOCARDIOGRAM COMPLETE  Result Date: 12/02/2019    ECHOCARDIOGRAM REPORT   Patient Name:   MASARU CHAMBERLIN Date of Exam: 12/02/2019 Medical Rec #:  [id number]       Height:       71.0 in Accession #:    [id number]      Weight:       233.0 lb Date of Birth:  07-Apr-1965       BSA:          2.250 m Patient Age:    35 years        BP:           122/85 mmHg Patient Gender: M               HR:           65 bpm. Exam Location:  Inpatient Procedure: 2D Echo, Cardiac  Doppler and Color Doppler Indications:    Syncope 780.2 / R55  History:        Patient has no prior history of Echocardiogram examinations.                 COPD; Risk Factors:Current Smoker, Dyslipidemia and                 Hypertension. History of pulmonary embolism.  Sonographer:    Alvino Chapel RCS Referring Phys: [id number] [place]  1. Left ventricular ejection fraction, by estimation, is 60 to 65%. The left ventricle has normal function. The left ventricle has no regional wall motion abnormalities. There is moderate left ventricular hypertrophy. Left ventricular diastolic parameters were normal.  2. Right ventricular systolic function is normal. The right ventricular size is normal.  3. The mitral valve is normal in structure. No evidence of mitral valve regurgitation. No evidence of mitral stenosis.  4. The aortic valve has an indeterminant number of cusps. There is mild calcification of the aortic valve. There is mild thickening of the aortic valve. Aortic valve regurgitation is not visualized. No aortic stenosis is present.  5. The inferior vena cava is normal in size with greater than 50% respiratory variability, suggesting right atrial pressure of 3 mmHg. FINDINGS  Left Ventricle: Left ventricular ejection fraction, by estimation, is 60 to 65%. The left ventricle has normal function. The left ventricle has no regional wall motion abnormalities. The left ventricular internal cavity size was normal in size. There is  moderate left ventricular hypertrophy. Left ventricular diastolic parameters were normal. Right Ventricle: The right ventricular size is normal. No increase in right ventricular wall thickness. Right ventricular systolic function is normal. Left Atrium: Left atrial size was normal in size. Right Atrium: Right atrial size was normal in size. Pericardium: There is no evidence of pericardial effusion. Mitral Valve: The mitral valve is normal in structure. No evidence of mitral  valve regurgitation. No evidence of mitral valve stenosis. Tricuspid Valve: The tricuspid valve is normal in structure. Tricuspid valve regurgitation is not demonstrated. No evidence of tricuspid stenosis. Aortic Valve: The aortic valve has an indeterminant number of cusps. There is mild calcification of the aortic valve. There is mild thickening of the aortic valve. There is mild aortic valve annular calcification. Aortic valve regurgitation is not visualized. No aortic stenosis is present. Aortic valve mean gradient measures 7.0 mmHg. Aortic valve peak gradient measures 13.0 mmHg. Aortic valve area, by VTI measures 1.85 cm. Pulmonic Valve: The pulmonic valve was not well visualized. Pulmonic valve regurgitation is not visualized.  No evidence of pulmonic stenosis. Aorta: The aortic root is normal in size and structure. Pulmonary Artery: Indeterminant PASP, inadequate TR jet. Venous: The inferior vena cava is normal in size with greater than 50% respiratory variability, suggesting right atrial pressure of 3 mmHg. IAS/Shunts: No atrial level shunt detected by color flow Doppler.  LEFT VENTRICLE PLAX 2D LVIDd:         4.40 cm  Diastology LVIDs:         3.00 cm  LV e' medial:    11.60 cm/s LV PW:         1.40 cm  LV E/e' medial:  7.8 LV IVS:        1.50 cm  LV e' lateral:   16.80 cm/s LVOT diam:     2.10 cm  LV E/e' lateral: 5.4 LV SV:         74 LV SV Index:   33 LVOT Area:     3.46 cm  RIGHT VENTRICLE RV S prime:     13.30 cm/s TAPSE (M-mode): 2.2 cm LEFT ATRIUM             Index       RIGHT ATRIUM           Index LA diam:        3.20 cm 1.42 cm/m  RA Area:     19.40 cm LA Vol (A2C):   60.4 ml 26.84 ml/m RA Volume:   55.60 ml  24.71 ml/m LA Vol (A4C):   58.0 ml 25.78 ml/m LA Biplane Vol: 62.0 ml 27.56 ml/m  AORTIC VALVE AV Area (Vmax):    2.00 cm AV Area (Vmean):   2.01 cm AV Area (VTI):     1.85 cm AV Vmax:           180.00 cm/s AV Vmean:          125.000 cm/s AV VTI:            0.398 m AV Peak Grad:       13.0 mmHg AV Mean Grad:      7.0 mmHg LVOT Vmax:         104.00 cm/s LVOT Vmean:        72.400 cm/s LVOT VTI:          0.213 m LVOT/AV VTI ratio: 0.54  AORTA Ao Root diam: 3.20 cm MITRAL VALVE MV Area (PHT): 3.72 cm    SHUNTS MV Decel Time: 204 msec    Systemic VTI:  0.21 m MV E velocity: 90.80 cm/s  Systemic Diam: 2.10 cm MV A velocity: 63.00 cm/s MV E/A ratio:  1.44 Carlyle Dolly MD Electronically signed by Carlyle Dolly MD Signature Date/Time: 12/02/2019/3:59:43 PM    Final    CT HEAD CODE STROKE WO CONTRAST  Result Date: 12/01/2019 CLINICAL DATA:  Code stroke.  54 year old male EXAM: CT HEAD WITHOUT CONTRAST TECHNIQUE: Contiguous axial images were obtained from the base of the skull through the vertex without intravenous contrast. COMPARISON:  Brain MRI 11/07/2003. FINDINGS: Brain: Cerebral volume is not significantly changed since 2006. No midline shift, ventriculomegaly, mass effect, evidence of mass lesion, intracranial hemorrhage or evidence of cortically based acute infarction. Gray-white matter differentiation is within normal limits throughout the brain. No cortical encephalomalacia identified. Vascular: Calcified atherosclerosis at the skull base. No suspicious intracranial vascular hyperdensity. Skull: Negative. Sinuses/Orbits: Visualized paranasal sinuses and mastoids are clear. Other: Visualized orbits and scalp soft tissues are within normal limits. ASPECTS [facility] Stroke Program  Early CT Score) Total score (0-10 with 10 being normal): 10 IMPRESSION: 1. Normal noncontrast CT appearance of the brain. ASPECTS 10. 2. These results were communicated to Dr. Theda Sers at 7:03 pm on 12/01/2019 by text page via the [facility] messaging system. Electronically Signed   By: Genevie Ann M.D.   On: 12/01/2019 19:03   CT ANGIO HEAD CODE STROKE  Result Date: 12/01/2019 CLINICAL DATA:  54 year old male code stroke presentation. EXAM: CT ANGIOGRAPHY HEAD AND NECK TECHNIQUE: Multidetector CT imaging of the head and  neck was performed using the standard protocol during bolus administration of intravenous contrast. Multiplanar CT image reconstructions and MIPs were obtained to evaluate the vascular anatomy. Carotid stenosis measurements (when applicable) are obtained utilizing NASCET criteria, using the distal internal carotid diameter as the denominator. CONTRAST:  21mL OMNIPAQUE IOHEXOL 350 MG/ML SOLN COMPARISON:  Plain head CT 1855 hours today. FINDINGS: CTA NECK Skeleton: No acute osseous abnormality identified. Upper chest: Paraseptal emphysema in the medial left upper lobe. Atelectatic changes to the trachea. No superior mediastinal lymphadenopathy. Other neck: Reflux of venous contrast from the injected left subclavian into the paravertebral venous system greater on the left. No acute finding in the neck. Aortic arch: 4 vessel arch configuration. The left vertebral arises directly from the arch. Soft plaque in the arch and at most of the great vessel origins. Right carotid system: Soft plaque at the brachiocephalic artery origin without significant stenosis. Normal right CCA. Negative right carotid bifurcation and cervical right ICA. Left carotid system: Soft plaque at the left CCA origin without stenosis. Mild plaque at the left ICA origin and bulb without stenosis. Vertebral arteries: Mild plaque in the proximal right subclavian artery without stenosis. Normal right vertebral artery origin. Right vertebral is patent and normal to the skull base. Left vertebral arises directly from the arch with minimal plaque at its origin. Patent left vertebral to the skull base with no plaque or stenosis identified. CTA HEAD Posterior circulation: Patent distal vertebral arteries and vertebrobasilar junction. The right V4 segment is mildly dominant. There is mild irregularity and stenosis in the distal left V4 segment. Normal right PICA origin. The left AICA may be dominant. Patent SCA and PCA origins. Posterior communicating arteries  are diminutive or absent. Bilateral PCA branches are within normal limits. Anterior circulation: Both ICA siphons are patent. On the left there is mild plaque without stenosis. Minimal plaque on the right without stenosis. Patent carotid termini. Patent MCA and ACA origins. Mildly dominant left A1. Anterior communicating artery and bilateral ACA branches are within normal limits. Left MCA M1 segment and trifurcation are patent without stenosis. Left MCA branches are within normal limits. Right MCA M1 segment and bifurcation are patent without stenosis. Right MCA branches are within normal limits. Venous sinuses: Patent. Anatomic variants: Left vertebral artery arises directly from the arch and is mildly non dominant. Mildly dominant left ACA A1. Review of the MIP images confirms the above findings IMPRESSION: 1. Negative for large vessel occlusion. 2. Generally mild atherosclerosis in the head and neck. No significant arterial stenosis identified. 3. Emphysema (ICD10-J43.9). Vascular results were communicated to Dr. Lorrin Goodell at 7:20 pmon 10/1/2021by text page via the [facility] messaging system. Electronically Signed   By: Genevie Ann M.D.   On: 12/01/2019 19:20   CT ANGIO NECK CODE STROKE  Result Date: 12/01/2019 CLINICAL DATA:  54 year old male code stroke presentation. EXAM: CT ANGIOGRAPHY HEAD AND NECK TECHNIQUE: Multidetector CT imaging of the head and neck was performed using the standard  protocol during bolus administration of intravenous contrast. Multiplanar CT image reconstructions and MIPs were obtained to evaluate the vascular anatomy. Carotid stenosis measurements (when applicable) are obtained utilizing NASCET criteria, using the distal internal carotid diameter as the denominator. CONTRAST:  47mL OMNIPAQUE IOHEXOL 350 MG/ML SOLN COMPARISON:  Plain head CT 1855 hours today. FINDINGS: CTA NECK Skeleton: No acute osseous abnormality identified. Upper chest: Paraseptal emphysema in the medial left upper  lobe. Atelectatic changes to the trachea. No superior mediastinal lymphadenopathy. Other neck: Reflux of venous contrast from the injected left subclavian into the paravertebral venous system greater on the left. No acute finding in the neck. Aortic arch: 4 vessel arch configuration. The left vertebral arises directly from the arch. Soft plaque in the arch and at most of the great vessel origins. Right carotid system: Soft plaque at the brachiocephalic artery origin without significant stenosis. Normal right CCA. Negative right carotid bifurcation and cervical right ICA. Left carotid system: Soft plaque at the left CCA origin without stenosis. Mild plaque at the left ICA origin and bulb without stenosis. Vertebral arteries: Mild plaque in the proximal right subclavian artery without stenosis. Normal right vertebral artery origin. Right vertebral is patent and normal to the skull base. Left vertebral arises directly from the arch with minimal plaque at its origin. Patent left vertebral to the skull base with no plaque or stenosis identified. CTA HEAD Posterior circulation: Patent distal vertebral arteries and vertebrobasilar junction. The right V4 segment is mildly dominant. There is mild irregularity and stenosis in the distal left V4 segment. Normal right PICA origin. The left AICA may be dominant. Patent SCA and PCA origins. Posterior communicating arteries are diminutive or absent. Bilateral PCA branches are within normal limits. Anterior circulation: Both ICA siphons are patent. On the left there is mild plaque without stenosis. Minimal plaque on the right without stenosis. Patent carotid termini. Patent MCA and ACA origins. Mildly dominant left A1. Anterior communicating artery and bilateral ACA branches are within normal limits. Left MCA M1 segment and trifurcation are patent without stenosis. Left MCA branches are within normal limits. Right MCA M1 segment and bifurcation are patent without stenosis. Right  MCA branches are within normal limits. Venous sinuses: Patent. Anatomic variants: Left vertebral artery arises directly from the arch and is mildly non dominant. Mildly dominant left ACA A1. Review of the MIP images confirms the above findings IMPRESSION: 1. Negative for large vessel occlusion. 2. Generally mild atherosclerosis in the head and neck. No significant arterial stenosis identified. 3. Emphysema (ICD10-J43.9). Vascular results were communicated to Dr. Lorrin Goodell at 7:20 pmon 10/1/2021by text page via the [facility] messaging system. Electronically Signed   By: Genevie Ann M.D.   On: 12/01/2019 19:20     Subjective: No acute issues or events overnight, ANO x4 back to baseline denies nausea vomiting diarrhea constipation headache fevers or chills.   Discharge Exam: Vitals:   12/12/19 0828 12/12/19 1125  BP:  117/81  Pulse:  89  Resp:  18  Temp:  98 F (36.7 C)  SpO2: 93% 96%   Vitals:   12/11/19 2330 12/12/19 0332 12/12/19 0828 12/12/19 1125  BP: 122/66 129/65  117/81  Pulse: 70 75  89  Resp: 16 18  18   Temp: 98.3 F (36.8 C) 98 F (36.7 C)  98 F (36.7 C)  TempSrc: Oral Oral  Oral  SpO2: 99% 98% 93% 96%  Weight:      Height:        General: Pt is  alert, awake, not in acute distress Cardiovascular: RRR, S1/S2 +, no rubs, no gallops Respiratory: CTA bilaterally, no wheezing, no rhonchi Abdominal: Soft, NT, ND, bowel sounds + Extremities: no edema, no cyanosis    The results of significant diagnostics from this hospitalization (including imaging, microbiology, ancillary and laboratory) are listed below for reference.     Microbiology: Recent Results (from the past 240 hour(s))  Culture, blood (Routine X 2) w Reflex to ID Panel     Status: None   Collection Time: 12/04/19  9:48 PM   Specimen: BLOOD  Result Value Ref Range Status   Specimen Description BLOOD LEFT ANTECUBITAL  Final   Special Requests   Final    BOTTLES DRAWN AEROBIC AND ANAEROBIC Blood Culture results  may not be optimal due to an inadequate volume of blood received in culture bottles   Culture   Final    NO GROWTH 5 DAYS Performed at [facility], [place] [address], [place], [place] [address]    Report Status 12/09/2019 FINAL  Final  Culture, blood (Routine X 2) w Reflex to ID Panel     Status: None   Collection Time: 12/04/19  9:53 PM   Specimen: BLOOD RIGHT HAND  Result Value Ref Range Status   Specimen Description BLOOD RIGHT HAND  Final   Special Requests   Final    BOTTLES DRAWN AEROBIC ONLY Blood Culture adequate volume   Culture   Final    NO GROWTH 5 DAYS Performed at [facility], [place] [address], [place], [place] [address]    Report Status 12/09/2019 FINAL  Final     Labs: BNP (last 3 results) No results for input(s): BNP in the last 8760 hours. Basic Metabolic Panel: Recent Labs  Lab 12/06/19 0354 12/07/19 0519  NA 136 136  K 4.5 3.6  CL 99 98  CO2 23 25  GLUCOSE 89 99  BUN 13 11  CREATININE 0.73 0.70  CALCIUM 8.9 9.1   Liver Function Tests: No results for input(s): AST, ALT, ALKPHOS, BILITOT, PROT, ALBUMIN in the last 168 hours. No results for input(s): LIPASE, AMYLASE in the last 168 hours. No results for input(s): AMMONIA in the last 168 hours. CBC: Recent Labs  Lab 12/06/19 0354 12/07/19 0519  WBC 9.1 8.3  HGB 14.3 14.7  HCT 43.7 44.8  MCV 95.0 94.9  PLT 201 224   Cardiac Enzymes: No results for input(s): CKTOTAL, CKMB, CKMBINDEX, TROPONINI in the last 168 hours. BNP: Invalid input(s): POCBNP CBG: Recent Labs  Lab 12/09/19 0909  GLUCAP 95   D-Dimer No results for input(s): DDIMER in the last 72 hours. Hgb A1c No results for input(s): HGBA1C in the last 72 hours. Lipid Profile No results for input(s): CHOL, HDL, LDLCALC, TRIG, CHOLHDL, LDLDIRECT in the last 72 hours. Thyroid function studies No results for input(s): TSH, T4TOTAL, T3FREE, THYROIDAB in the last 72 hours.  Invalid input(s): FREET3 Anemia work up No results  for input(s): VITAMINB12, FOLATE, FERRITIN, TIBC, IRON, RETICCTPCT in the last 72 hours. Urinalysis    Component Value Date/Time   COLORURINE YELLOW 12/01/2019 1918   APPEARANCEUR CLEAR 12/01/2019 1918   LABSPEC 1.034 (H) 12/01/2019 1918   PHURINE 5.0 12/01/2019 1918   GLUCOSEU NEGATIVE 12/01/2019 1918   HGBUR SMALL (A) 12/01/2019 1918   BILIRUBINUR NEGATIVE 12/01/2019 1918   KETONESUR NEGATIVE 12/01/2019 1918   PROTEINUR NEGATIVE 12/01/2019 1918   NITRITE NEGATIVE 12/01/2019 1918   LEUKOCYTESUR NEGATIVE 12/01/2019 1918  Sepsis Labs Invalid input(s): PROCALCITONIN,  WBC,  LACTICIDVEN Microbiology Recent Results (from the past 240 hour(s))  Culture, blood (Routine X 2) w Reflex to ID Panel     Status: None   Collection Time: 12/04/19  9:48 PM   Specimen: BLOOD  Result Value Ref Range Status   Specimen Description BLOOD LEFT ANTECUBITAL  Final   Special Requests   Final    BOTTLES DRAWN AEROBIC AND ANAEROBIC Blood Culture results may not be optimal due to an inadequate volume of blood received in culture bottles   Culture   Final    NO GROWTH 5 DAYS Performed at [facility], [place] [address] [place], [place] [address]    Report Status 12/09/2019 FINAL  Final  Culture, blood (Routine X 2) w Reflex to ID Panel     Status: None   Collection Time: 12/04/19  9:53 PM   Specimen: BLOOD RIGHT HAND  Result Value Ref Range Status   Specimen Description BLOOD RIGHT HAND  Final   Special Requests   Final    BOTTLES DRAWN AEROBIC ONLY Blood Culture adequate volume   Culture   Final    NO GROWTH 5 DAYS Performed at [facility], [place] [address] [place], [place] [address]    Report Status 12/09/2019 FINAL  Final     Time coordinating discharge: Over 30 minutes  SIGNED:   Little Ishikawa, DO [facility] 12/12/2019, 3:04 PM Pager   If 7PM-7AM, please contact night-coverage [email or web address]

## 2019-12-25 DIAGNOSIS — Z79899 Other long term (current) drug therapy: Secondary | ICD-10-CM | POA: Diagnosis not present

## 2020-01-29 DIAGNOSIS — J449 Chronic obstructive pulmonary disease, unspecified: Secondary | ICD-10-CM | POA: Diagnosis not present

## 2020-01-29 DIAGNOSIS — J069 Acute upper respiratory infection, unspecified: Secondary | ICD-10-CM | POA: Diagnosis not present

## 2020-02-25 ENCOUNTER — Telehealth: Payer: Medicare Other | Admitting: Family

## 2020-02-25 DIAGNOSIS — J019 Acute sinusitis, unspecified: Secondary | ICD-10-CM | POA: Diagnosis not present

## 2020-02-25 DIAGNOSIS — B9689 Other specified bacterial agents as the cause of diseases classified elsewhere: Secondary | ICD-10-CM

## 2020-02-25 MED ORDER — DOXYCYCLINE HYCLATE 100 MG PO TABS: 100.0000 mg | ORAL_TABLET | Freq: Two times a day (BID) | ORAL | 0 refills | Status: AC

## 2020-02-25 NOTE — Progress Notes (Signed)
We are sorry that you are not feeling well.  Here is how we plan to help!  Based on what you have shared with me it looks like you have sinusitis.  Sinusitis is inflammation and infection in the sinus cavities of the head.  Based on your presentation I believe you most likely have Acute Bacterial Sinusitis.  This is an infection caused by bacteria and is treated with antibiotics. I have prescribed Doxycycline 100mg by mouth twice a day for 10 days. You may use an oral decongestant such as Mucinex D or if you have glaucoma or high blood pressure use plain Mucinex. Saline nasal spray help and can safely be used as often as needed for congestion.  If you develop worsening sinus pain, fever or notice severe headache and vision changes, or if symptoms are not better after completion of antibiotic, please schedule an appointment with a health care provider.    Sinus infections are not as easily transmitted as other respiratory infection, however we still recommend that you avoid close contact with loved ones, especially the very young and elderly.  Remember to wash your hands thoroughly throughout the day as this is the number one way to prevent the spread of infection!  Home Care:  Only take medications as instructed by your medical team.  Complete the entire course of an antibiotic.  Do not take these medications with alcohol.  A steam or ultrasonic humidifier can help congestion.  You can place a towel over your head and breathe in the steam from hot water coming from a faucet.  Avoid close contacts especially the very young and the elderly.  Cover your mouth when you cough or sneeze.  Always remember to wash your hands.  Get Help Right Away If:  You develop worsening fever or sinus pain.  You develop a severe head ache or visual changes.  Your symptoms persist after you have completed your treatment plan.  Make sure you  Understand these instructions.  Will watch your  condition.  Will get help right away if you are not doing well or get worse.  Your e-visit answers were reviewed by a board certified advanced clinical practitioner to complete your personal care plan.  Depending on the condition, your plan could have included both over the counter or prescription medications.  If there is a problem please reply  once you have received a response from your provider.  Your safety is important to us.  If you have drug allergies check your prescription carefully.    You can use MyChart to ask questions about today's visit, request a non-urgent call back, or ask for a work or school excuse for 24 hours related to this e-Visit. If it has been greater than 24 hours you will need to follow up with your provider, or enter a new e-Visit to address those concerns.  You will get an e-mail in the next two days asking about your experience.  I hope that your e-visit has been valuable and will speed your recovery. Thank you for using e-visits.  Greater than 5 minutes, yet less than 10 minutes of time have been spent researching, coordinating, and implementing care for this patient today.  Thank you for the details you included in the comment boxes. Those details are very helpful in determining the best course of treatment for you and help us to provide the best care.  

## 2020-06-21 DIAGNOSIS — E782 Mixed hyperlipidemia: Secondary | ICD-10-CM | POA: Diagnosis not present

## 2020-06-21 DIAGNOSIS — H6092 Unspecified otitis externa, left ear: Secondary | ICD-10-CM | POA: Diagnosis not present

## 2020-06-21 DIAGNOSIS — Z0000 Encounter for general adult medical examination without abnormal findings: Secondary | ICD-10-CM | POA: Diagnosis not present

## 2020-06-21 DIAGNOSIS — J449 Chronic obstructive pulmonary disease, unspecified: Secondary | ICD-10-CM | POA: Diagnosis not present

## 2020-06-21 DIAGNOSIS — K219 Gastro-esophageal reflux disease without esophagitis: Secondary | ICD-10-CM | POA: Diagnosis not present

## 2020-06-21 DIAGNOSIS — Z86711 Personal history of pulmonary embolism: Secondary | ICD-10-CM | POA: Diagnosis not present

## 2020-10-24 DIAGNOSIS — U071 COVID-19: Secondary | ICD-10-CM | POA: Diagnosis not present

## 2020-10-24 DIAGNOSIS — J449 Chronic obstructive pulmonary disease, unspecified: Secondary | ICD-10-CM | POA: Diagnosis not present

## 2020-12-11 DIAGNOSIS — Y708 Miscellaneous anesthesiology devices associated with adverse incidents, not elsewhere classified: Secondary | ICD-10-CM | POA: Diagnosis not present

## 2020-12-11 DIAGNOSIS — I10 Essential (primary) hypertension: Secondary | ICD-10-CM | POA: Diagnosis not present

## 2020-12-11 DIAGNOSIS — Z0000 Encounter for general adult medical examination without abnormal findings: Secondary | ICD-10-CM | POA: Diagnosis not present

## 2020-12-11 DIAGNOSIS — E782 Mixed hyperlipidemia: Secondary | ICD-10-CM | POA: Diagnosis not present

## 2020-12-11 DIAGNOSIS — Z79899 Other long term (current) drug therapy: Secondary | ICD-10-CM | POA: Diagnosis not present

## 2020-12-11 DIAGNOSIS — J449 Chronic obstructive pulmonary disease, unspecified: Secondary | ICD-10-CM | POA: Diagnosis not present

## 2020-12-11 DIAGNOSIS — Z23 Encounter for immunization: Secondary | ICD-10-CM | POA: Diagnosis not present

## 2021-06-26 DIAGNOSIS — E782 Mixed hyperlipidemia: Secondary | ICD-10-CM | POA: Diagnosis not present

## 2021-06-26 DIAGNOSIS — Z79899 Other long term (current) drug therapy: Secondary | ICD-10-CM | POA: Diagnosis not present

## 2021-06-26 DIAGNOSIS — Z23 Encounter for immunization: Secondary | ICD-10-CM | POA: Diagnosis not present

## 2021-06-26 DIAGNOSIS — K219 Gastro-esophageal reflux disease without esophagitis: Secondary | ICD-10-CM | POA: Diagnosis not present

## 2021-06-26 DIAGNOSIS — Z0000 Encounter for general adult medical examination without abnormal findings: Secondary | ICD-10-CM | POA: Diagnosis not present

## 2021-12-04 DIAGNOSIS — L03113 Cellulitis of right upper limb: Secondary | ICD-10-CM | POA: Diagnosis not present

## 2021-12-05 DIAGNOSIS — L03113 Cellulitis of right upper limb: Secondary | ICD-10-CM | POA: Diagnosis not present

## 2021-12-15 IMAGING — MR MR HEAD WO/W CM
7 of 12 series · 27 of 48 positions shown · IV contrast (Yes GAD)
Comparison: MRI 12/01/2019

CLINICAL DATA: Delirium.

EXAM:
MRI HEAD WITHOUT AND WITH CONTRAST
TECHNIQUE: Multiplanar, multiecho pulse sequences of the brain and surrounding
structures were obtained without and with intravenous contrast.
CONTRAST:  10mL GADAVIST GADOBUTROL 1 MMOL/ML IV SOLN

[Series 2: DWI · axial · 3.0mm · 0.94mm/px · z∈[-83,+68]mm · 9 of 104 slices shown (1 of 2)]
[im 1/104]
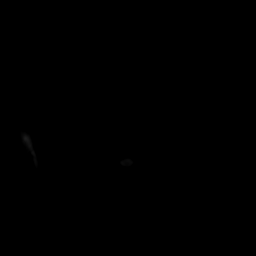
[im 13/104]
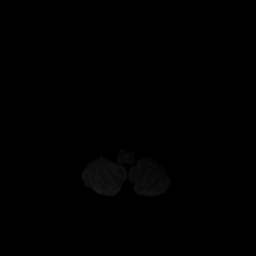
[im 26/104]
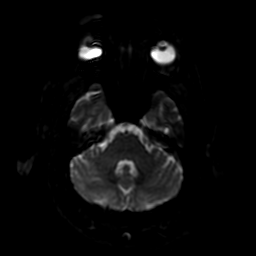
[im 39/104]
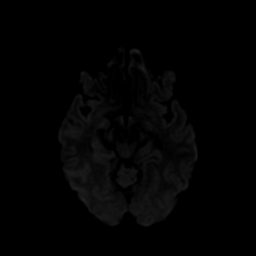
[im 52/104]
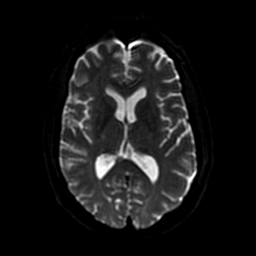
[im 65/104]
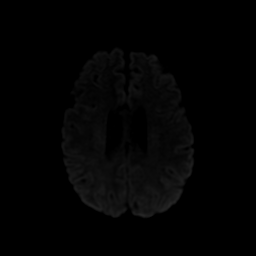
[im 78/104]
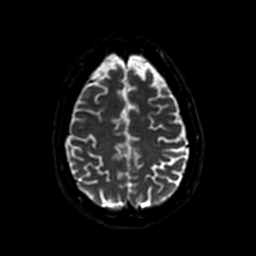
[im 91/104]
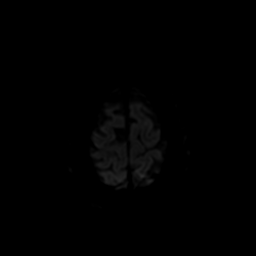
[im 104/104]
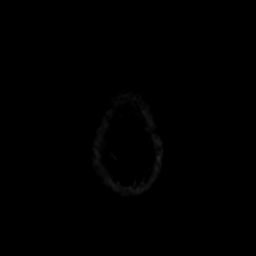

[Series 3: DWI · coronal · 4.0mm · 0.94mm/px · 6 of 78 slices shown (2 of 2)]
[im 1/78]
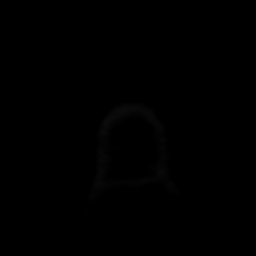
[im 16/78]
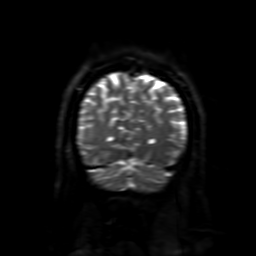
[im 31/78]
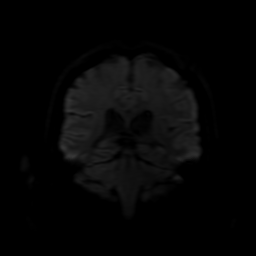
[im 47/78]
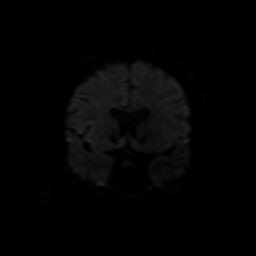
[im 62/78]
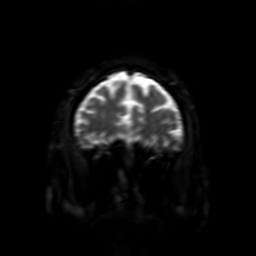
[im 78/78]
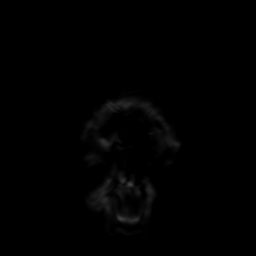

[Series 4: T2 · axial · 5.0mm · 0.23mm/px · 1 of 26 slices shown]
[im 1/26]
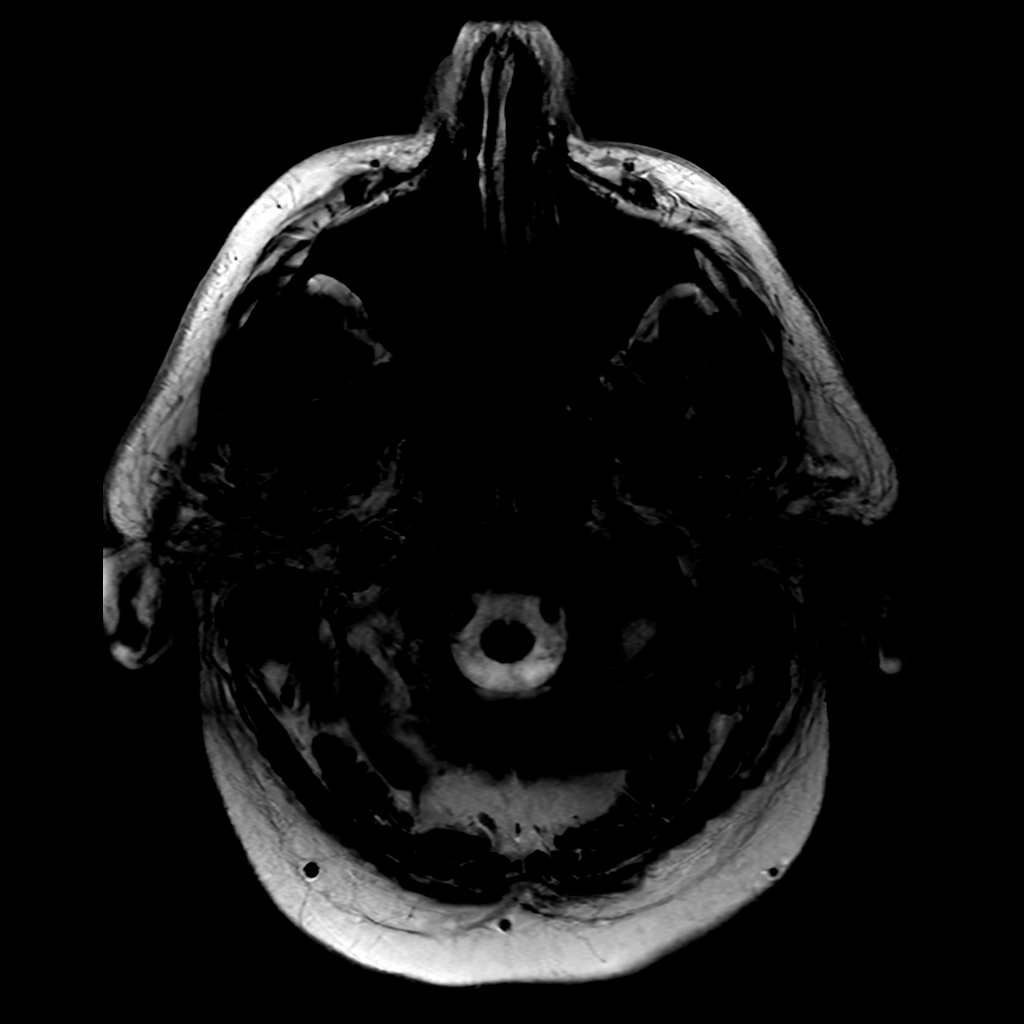

[Series 5: FLAIR · axial · 3.0mm · 0.41mm/px · z∈[-85,+64]mm · 2 of 26 slices shown (1 of 2)]
[im 1/26]
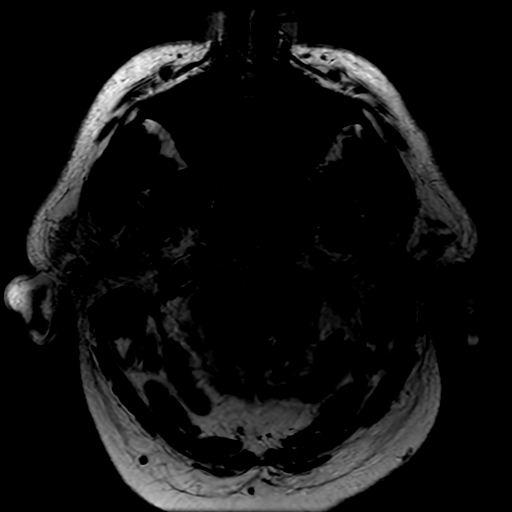
[im 26/26]
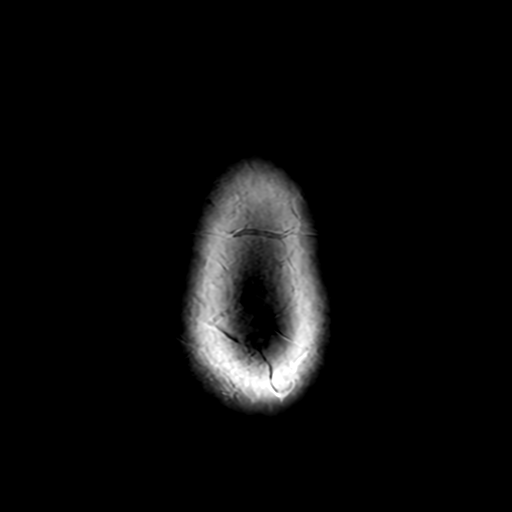

[Series 6: FLAIR · sagittal · 5.0mm · 0.23mm/px · 2 of 23 slices shown (2 of 2)]
[im 1/23]
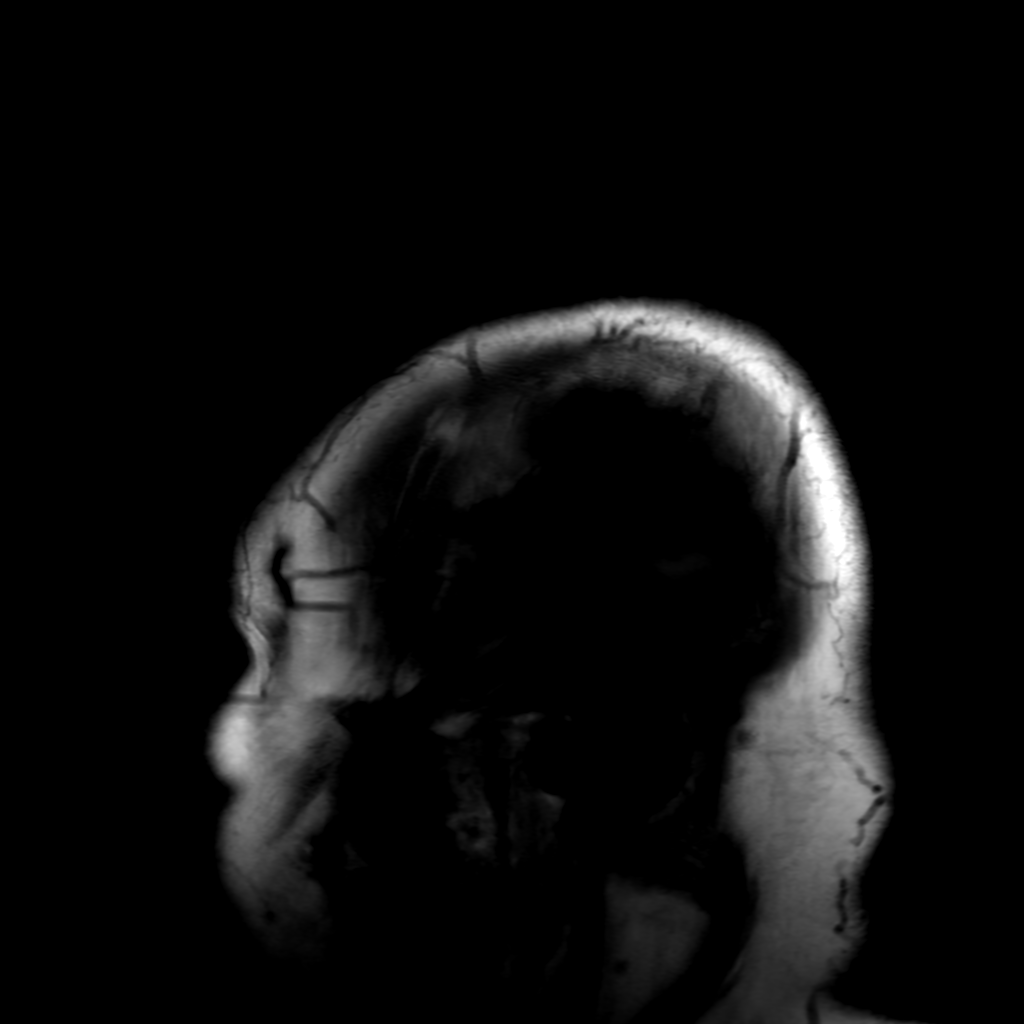
[im 23/23]
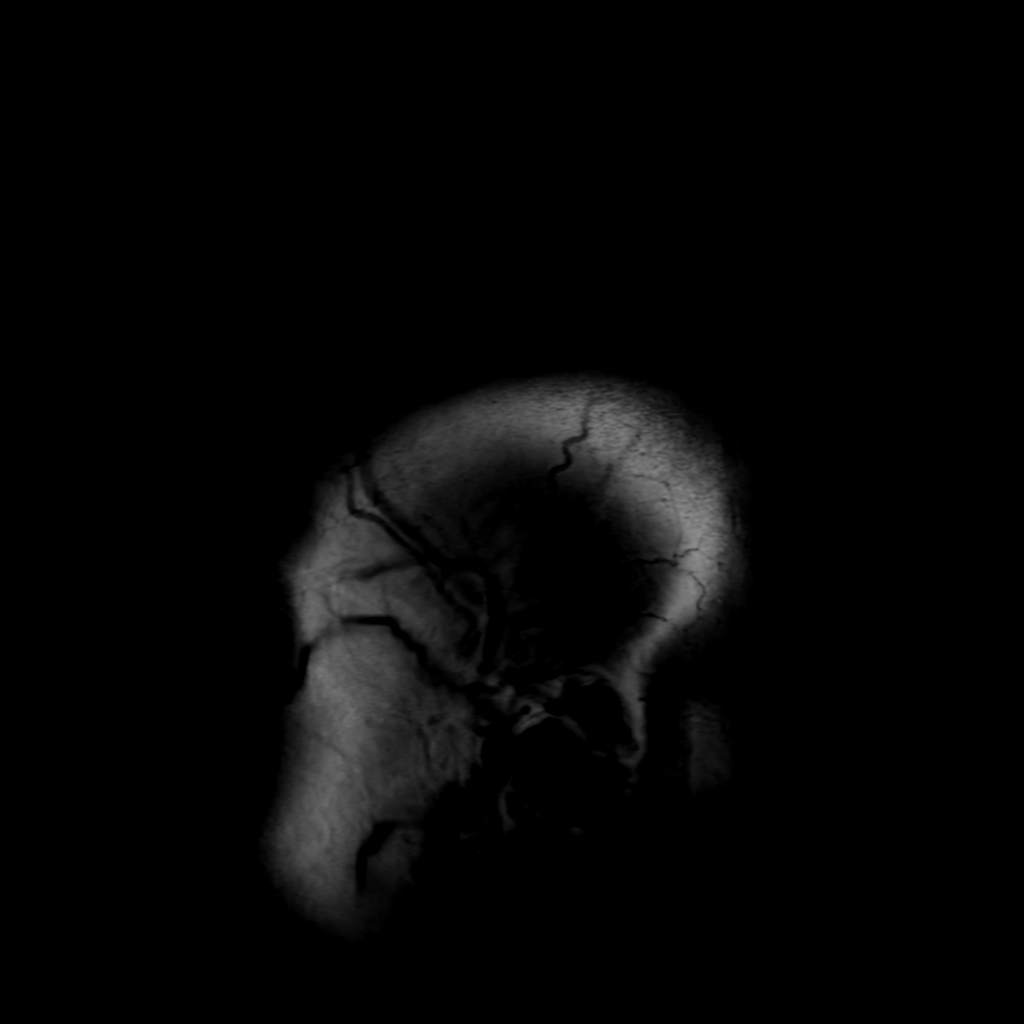

[Series 250: ADC · axial · 3.0mm · 0.94mm/px · z∈[-83,+68]mm · 4 of 52 slices shown (1 of 2)]
[im 1/52]
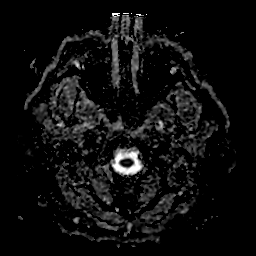
[im 18/52]
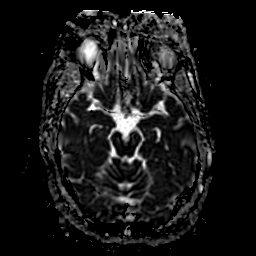
[im 35/52]
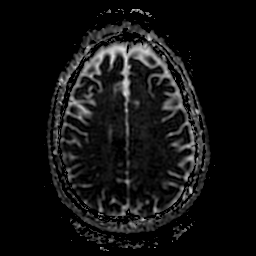
[im 52/52]
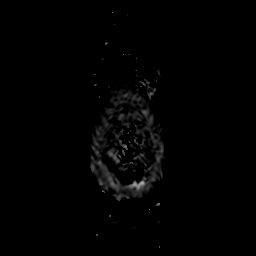

[Series 350: ADC · coronal · 4.0mm · 0.94mm/px · 3 of 39 slices shown (2 of 2)]
[im 1/39]
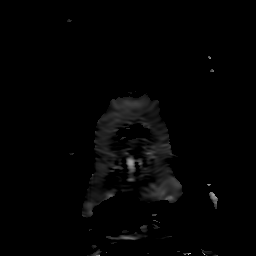
[im 20/39]
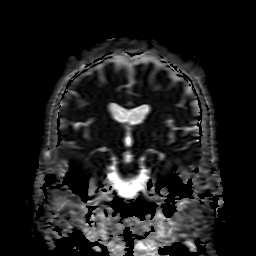
[im 39/39]
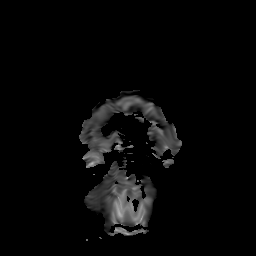

[27 of 48 positions shown; findings below may reference images not displayed]

FINDINGS: Brain: No acute infarction, hemorrhage, hydrocephalus, extra-axial
collection or mass lesion. No abnormal enhancement.

Vascular: Proximal arterial flow voids are maintained at the skull
base.

Skull and upper cervical spine: Normal marrow signal.

Sinuses/Orbits: Mild ethmoid air cell mucosal thickening. No
air-fluid levels. Cystic structures in the posterior nasopharynx
with peripheral enhancement, likely benign mucosal retention cysts.

Other: No mastoid effusions.
IMPRESSION: No evidence of acute intracranial abnormality.

## 2021-12-19 DIAGNOSIS — E782 Mixed hyperlipidemia: Secondary | ICD-10-CM | POA: Diagnosis not present

## 2021-12-19 DIAGNOSIS — Z23 Encounter for immunization: Secondary | ICD-10-CM | POA: Diagnosis not present

## 2021-12-19 DIAGNOSIS — I10 Essential (primary) hypertension: Secondary | ICD-10-CM | POA: Diagnosis not present

## 2021-12-19 DIAGNOSIS — Z86711 Personal history of pulmonary embolism: Secondary | ICD-10-CM | POA: Diagnosis not present

## 2022-07-07 DIAGNOSIS — E782 Mixed hyperlipidemia: Secondary | ICD-10-CM | POA: Diagnosis not present

## 2022-07-07 DIAGNOSIS — Z0000 Encounter for general adult medical examination without abnormal findings: Secondary | ICD-10-CM | POA: Diagnosis not present

## 2022-07-07 DIAGNOSIS — J449 Chronic obstructive pulmonary disease, unspecified: Secondary | ICD-10-CM | POA: Diagnosis not present

## 2022-10-21 DIAGNOSIS — R109 Unspecified abdominal pain: Secondary | ICD-10-CM | POA: Diagnosis not present

## 2022-10-27 ENCOUNTER — Encounter: Payer: Self-pay | Admitting: Family Medicine

## 2022-10-28 ENCOUNTER — Other Ambulatory Visit: Payer: Self-pay | Admitting: Family Medicine

## 2022-10-28 DIAGNOSIS — R109 Unspecified abdominal pain: Secondary | ICD-10-CM

## 2022-11-03 ENCOUNTER — Other Ambulatory Visit: Payer: Medicare Other

## 2022-11-06 ENCOUNTER — Ambulatory Visit
Admission: RE | Admit: 2022-11-06 | Discharge: 2022-11-06 | Disposition: A | Discharge status: Home / Self Care | Payer: Medicare Other | Source: Ambulatory Visit | Attending: Family Medicine | Admitting: Family Medicine

## 2022-11-06 DIAGNOSIS — R1909 Other intra-abdominal and pelvic swelling, mass and lump: Secondary | ICD-10-CM | POA: Diagnosis not present

## 2022-11-06 DIAGNOSIS — R109 Unspecified abdominal pain: Secondary | ICD-10-CM

## 2022-11-20 DIAGNOSIS — R070 Pain in throat: Secondary | ICD-10-CM | POA: Diagnosis not present

## 2022-12-18 DIAGNOSIS — Z86711 Personal history of pulmonary embolism: Secondary | ICD-10-CM | POA: Diagnosis not present

## 2022-12-18 DIAGNOSIS — Z23 Encounter for immunization: Secondary | ICD-10-CM | POA: Diagnosis not present

## 2022-12-18 DIAGNOSIS — K219 Gastro-esophageal reflux disease without esophagitis: Secondary | ICD-10-CM | POA: Diagnosis not present

## 2022-12-18 DIAGNOSIS — E782 Mixed hyperlipidemia: Secondary | ICD-10-CM | POA: Diagnosis not present

## 2022-12-18 DIAGNOSIS — I10 Essential (primary) hypertension: Secondary | ICD-10-CM | POA: Diagnosis not present

## 2023-07-12 DIAGNOSIS — Z0000 Encounter for general adult medical examination without abnormal findings: Secondary | ICD-10-CM | POA: Diagnosis not present

## 2023-07-12 DIAGNOSIS — Z79899 Other long term (current) drug therapy: Secondary | ICD-10-CM | POA: Diagnosis not present

## 2023-07-12 DIAGNOSIS — E782 Mixed hyperlipidemia: Secondary | ICD-10-CM | POA: Diagnosis not present

## 2023-09-29 DIAGNOSIS — R3911 Hesitancy of micturition: Secondary | ICD-10-CM | POA: Diagnosis not present

## 2023-09-29 DIAGNOSIS — R3912 Poor urinary stream: Secondary | ICD-10-CM | POA: Diagnosis not present

## 2023-09-30 ENCOUNTER — Other Ambulatory Visit: Payer: Self-pay | Admitting: Urology

## 2023-09-30 DIAGNOSIS — R972 Elevated prostate specific antigen [PSA]: Secondary | ICD-10-CM

## 2023-10-26 DIAGNOSIS — R0981 Nasal congestion: Secondary | ICD-10-CM | POA: Diagnosis not present

## 2023-10-26 DIAGNOSIS — R509 Fever, unspecified: Secondary | ICD-10-CM | POA: Diagnosis not present

## 2023-10-26 DIAGNOSIS — R062 Wheezing: Secondary | ICD-10-CM | POA: Diagnosis not present

## 2023-11-18 ENCOUNTER — Ambulatory Visit
Admission: RE | Admit: 2023-11-18 | Discharge: 2023-11-18 | Disposition: A | Discharge status: Home / Self Care | Source: Ambulatory Visit | Attending: Urology | Admitting: Urology

## 2023-11-18 DIAGNOSIS — R972 Elevated prostate specific antigen [PSA]: Secondary | ICD-10-CM

## 2023-11-18 MED ORDER — GADOPICLENOL 0.5 MMOL/ML IV SOLN
10.0000 mL | Freq: Once | INTRAVENOUS | Status: AC | PRN
  Administered 2023-11-18: 10 mL via INTRAVENOUS
